# Patient Record
Sex: Female | Born: 1980
Health system: Southern US, Community
[De-identification: ages and names within clinical notes are randomized; demographics above are authoritative.]

## PROBLEM LIST (undated history)

## (undated) DIAGNOSIS — N200 Calculus of kidney: Secondary | ICD-10-CM

## (undated) DIAGNOSIS — E119 Type 2 diabetes mellitus without complications: Secondary | ICD-10-CM

## (undated) HISTORY — DX: Calculus of kidney: N20.0

## (undated) HISTORY — PX: PELVIC LAPAROSCOPY: SHX162

## (undated) HISTORY — DX: Type 2 diabetes mellitus without complications: E11.9

---

## 2000-04-27 ENCOUNTER — Other Ambulatory Visit: Admission: RE | Admit: 2000-04-27 | Discharge: 2000-04-27 | Payer: Self-pay | Admitting: Gynecology

## 2001-05-02 ENCOUNTER — Other Ambulatory Visit: Admission: RE | Admit: 2001-05-02 | Discharge: 2001-05-02 | Payer: Self-pay | Admitting: Gynecology

## 2002-05-15 ENCOUNTER — Other Ambulatory Visit: Admission: RE | Admit: 2002-05-15 | Discharge: 2002-05-15 | Payer: Self-pay | Admitting: Gynecology

## 2002-07-11 ENCOUNTER — Ambulatory Visit (HOSPITAL_COMMUNITY): Admission: RE | Admit: 2002-07-11 | Discharge: 2002-07-11 | Payer: Self-pay | Admitting: Gynecology

## 2002-07-13 ENCOUNTER — Inpatient Hospital Stay (HOSPITAL_COMMUNITY): Admission: AD | Admit: 2002-07-13 | Discharge: 2002-07-13 | Payer: Self-pay | Admitting: Gynecology

## 2002-07-16 ENCOUNTER — Inpatient Hospital Stay (HOSPITAL_COMMUNITY): Admission: AD | Admit: 2002-07-16 | Discharge: 2002-07-16 | Payer: Self-pay | Admitting: Gynecology

## 2002-07-18 ENCOUNTER — Inpatient Hospital Stay: Admission: AD | Admit: 2002-07-18 | Discharge: 2002-07-18 | Payer: Self-pay | Admitting: Gynecology

## 2003-09-06 ENCOUNTER — Encounter (INDEPENDENT_AMBULATORY_CARE_PROVIDER_SITE_OTHER): Payer: Self-pay

## 2003-09-06 ENCOUNTER — Ambulatory Visit (HOSPITAL_BASED_OUTPATIENT_CLINIC_OR_DEPARTMENT_OTHER): Admission: RE | Admit: 2003-09-06 | Discharge: 2003-09-06 | Payer: Self-pay | Admitting: Obstetrics and Gynecology

## 2003-09-08 ENCOUNTER — Ambulatory Visit (HOSPITAL_COMMUNITY): Admission: RE | Admit: 2003-09-08 | Discharge: 2003-09-08 | Payer: Self-pay | Admitting: Obstetrics and Gynecology

## 2003-10-06 HISTORY — PX: DILATION AND CURETTAGE OF UTERUS: SHX78

## 2003-10-29 ENCOUNTER — Encounter (INDEPENDENT_AMBULATORY_CARE_PROVIDER_SITE_OTHER): Payer: Self-pay | Admitting: Specialist

## 2003-10-29 ENCOUNTER — Ambulatory Visit: Admission: AD | Admit: 2003-10-29 | Discharge: 2003-10-29 | Payer: Self-pay | Admitting: Obstetrics and Gynecology

## 2003-11-06 ENCOUNTER — Other Ambulatory Visit: Admission: RE | Admit: 2003-11-06 | Discharge: 2003-11-06 | Payer: Self-pay | Admitting: Obstetrics and Gynecology

## 2004-02-27 ENCOUNTER — Emergency Department (HOSPITAL_COMMUNITY): Admission: EM | Admit: 2004-02-27 | Discharge: 2004-02-27 | Payer: Self-pay | Admitting: Emergency Medicine

## 2004-06-06 IMAGING — CT CT ABDOMEN W/O CM
1 series · 16 of 32 positions shown, 20 images · non-contrast
Comparison: none

CLINICAL DATA: Abdominal pain, back pain and nausea.  Flank pain is on the right side.  
 CT OF THE ABDOMEN WITHOUT CONTRAST, 02/27/04
 No prior studies.
TECHNIQUE: Contiguous axial CT images were obtained from the adrenal glands through the iliac crests.  No contrast was administered.
TECHNIQUE: Contiguous axial CT images were obtained from the iliac crests through the proximal femurs.

[Series 3: — · axial · 0.61mm/px · z∈[-482,-146]mm · 16 of 92 slices shown, 20 images]
[im 6/92  soft-tissue]
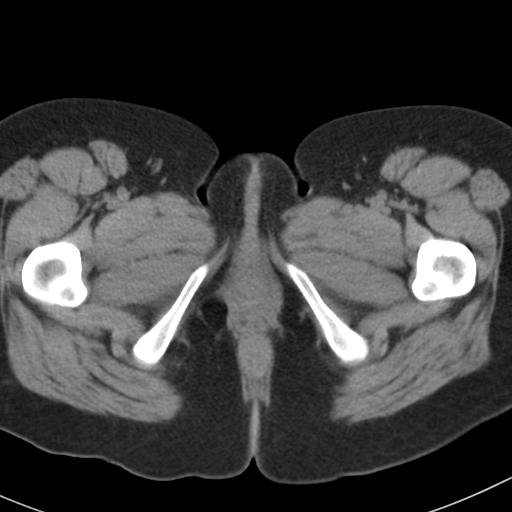
[im 6/92  bone]
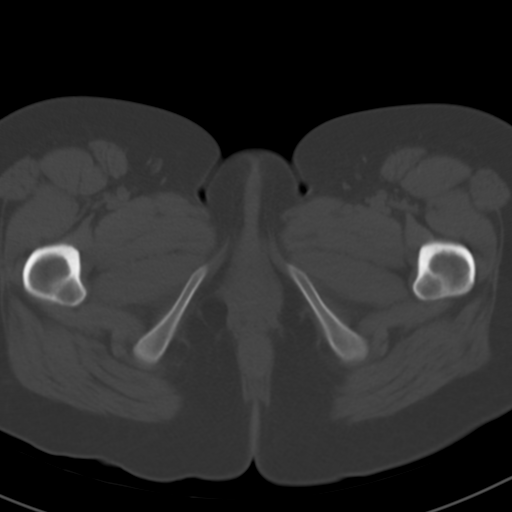
[im 12/92  soft-tissue]
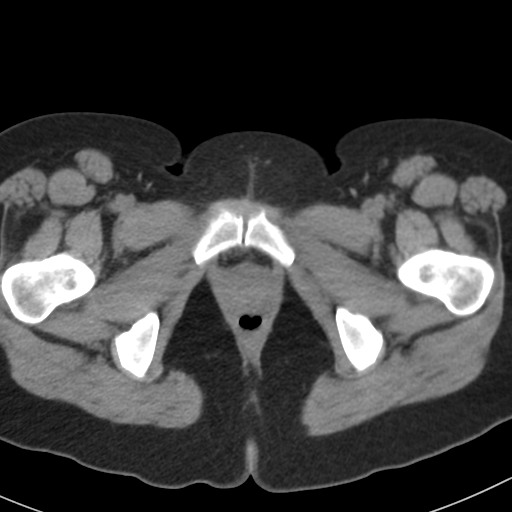
[im 18/92  soft-tissue]
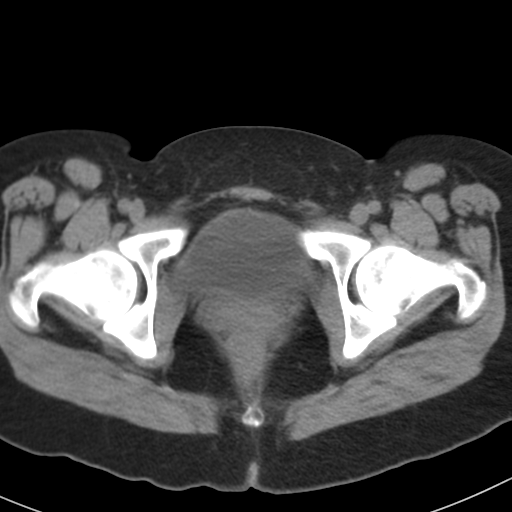
[im 24/92  soft-tissue]
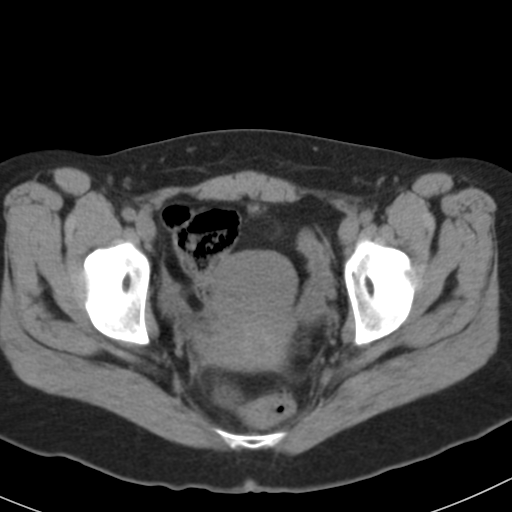
[im 30/92  soft-tissue]
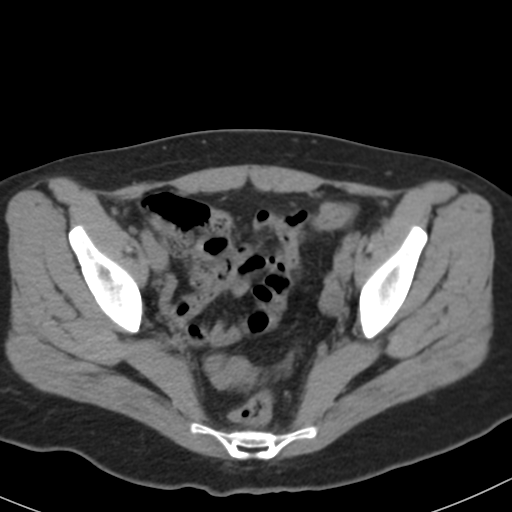
[im 36/92  soft-tissue]
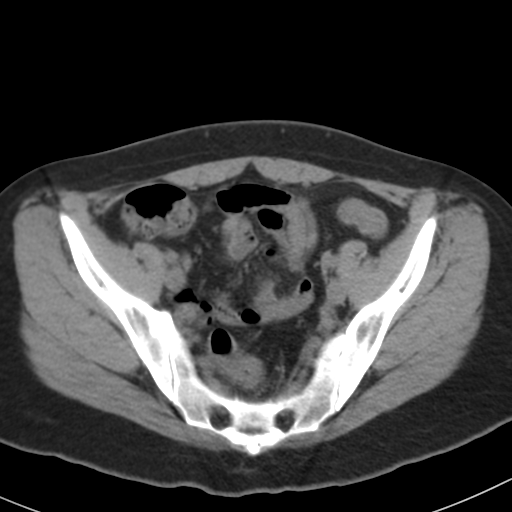
[im 42/92  soft-tissue]
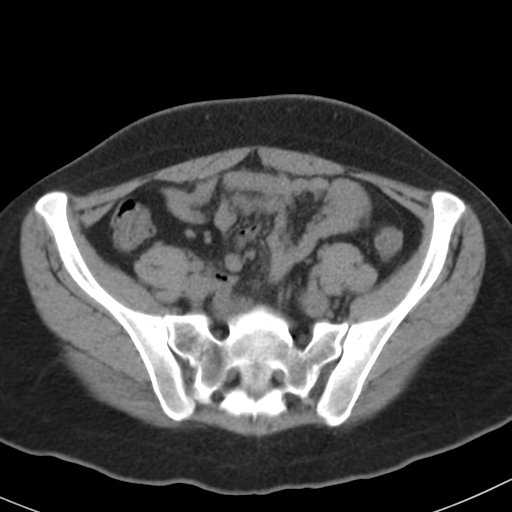
[im 50/92  soft-tissue]
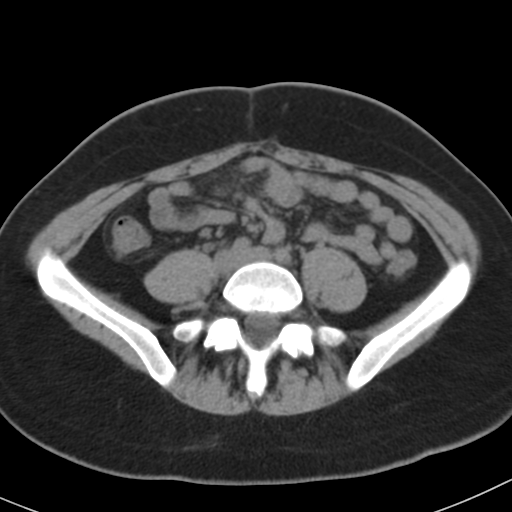
[im 56/92  soft-tissue]
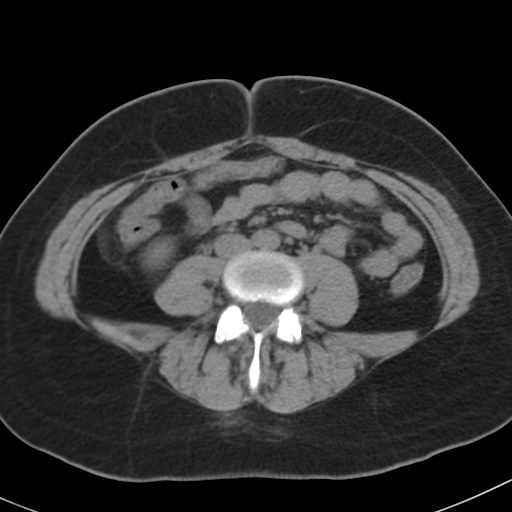
[im 56/92  bone]
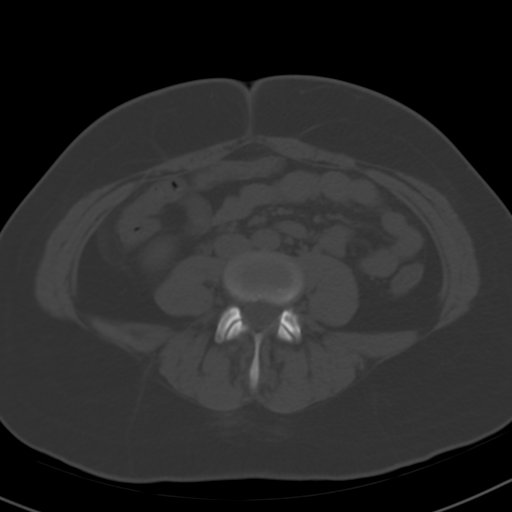
[im 62/92  soft-tissue]
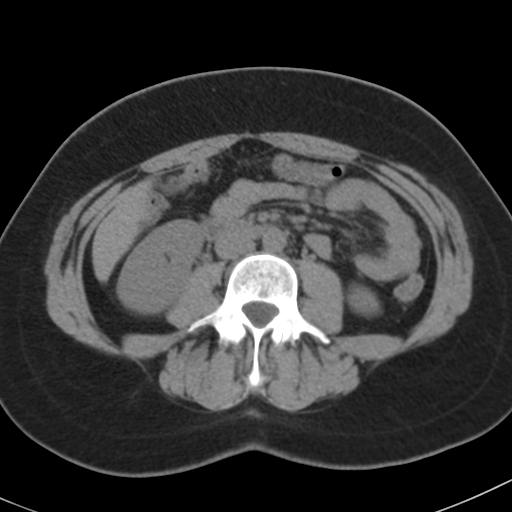
[im 68/92  soft-tissue]
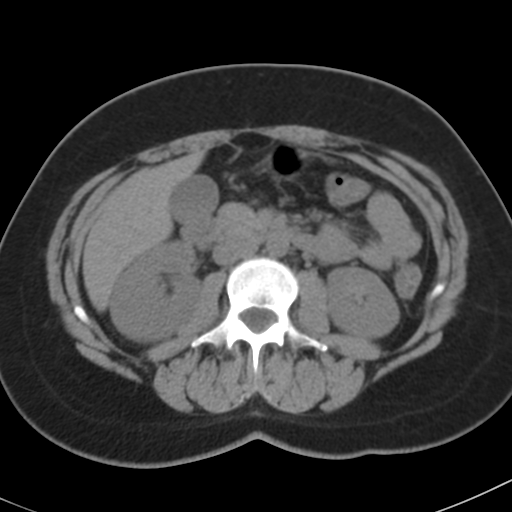
[im 74/92  soft-tissue]
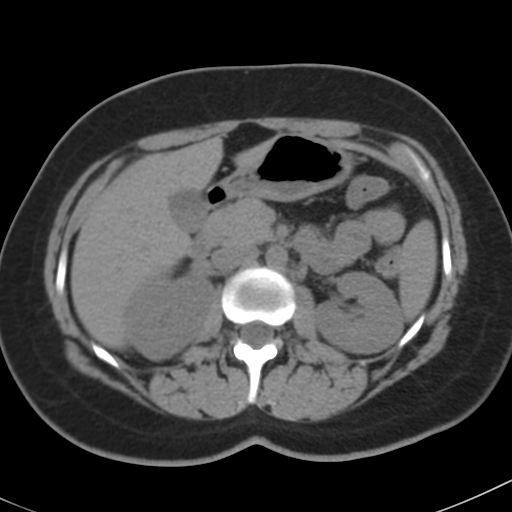
[im 80/92  soft-tissue]
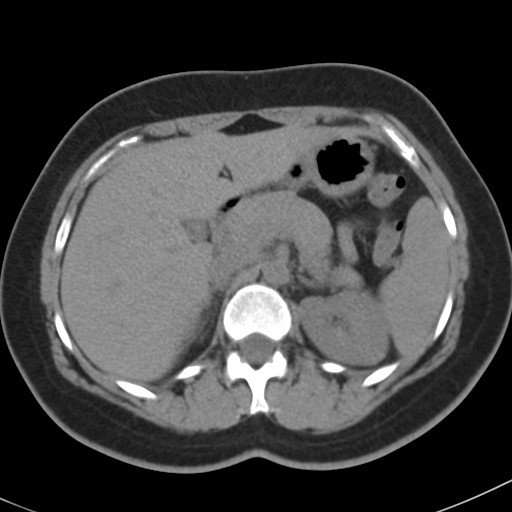
[im 80/92  lung]
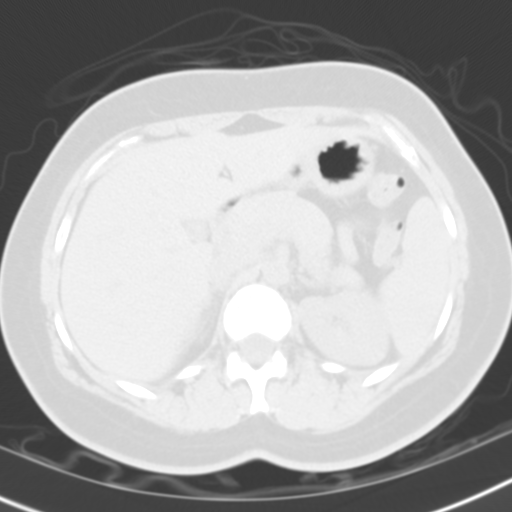
[im 83/92  lung]
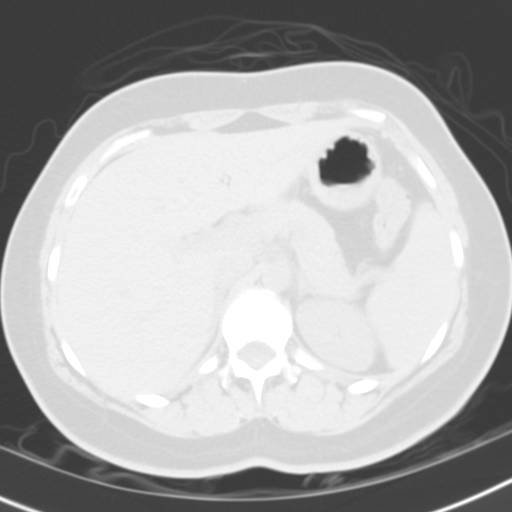
[im 86/92  soft-tissue]
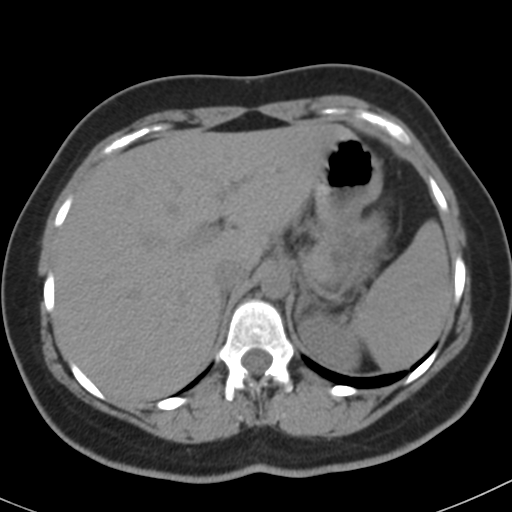
[im 86/92  lung]
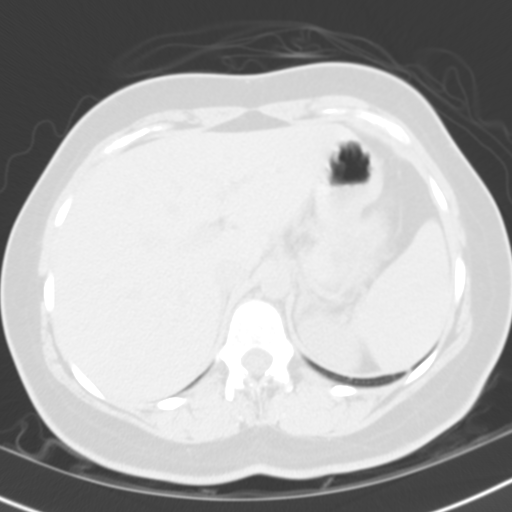
[im 89/92  lung]
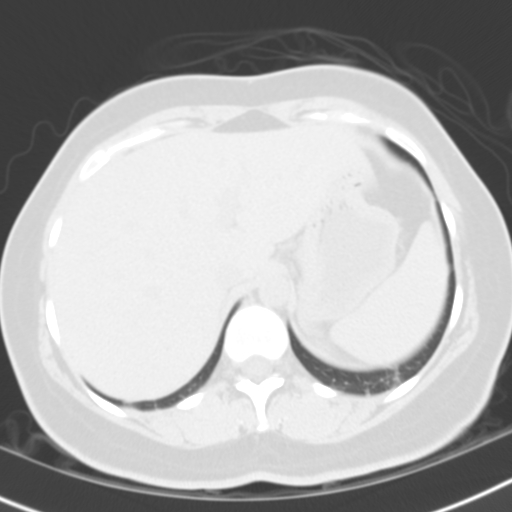

[16 of 32 positions shown; findings below may reference images not displayed]

FINDINGS: There is some subsegmental atelectasis in the left lung base.  There is right perirenal stranding and mild right hydronephrosis in addition to right hydroureter extending down to the level of a 2 mm left ureterovesical junction calculus.  
 IMPRESSION
 Obstructive 2 mm left ureterovesical junction calculus. 
 CT OF THE PELVIS WITHOUT CONTRAST
FINDINGS: The appendix is well visualized and appears unremarkable.  There is mild right hydroureter extending down to the level of a 2 mm mildly obstructive right UVJ calculus.  The visualized bowel appears unremarkable.  
 IMPRESSION
 2 mm mildly obstructive right UVJ calculus.

## 2004-12-17 ENCOUNTER — Other Ambulatory Visit: Admission: RE | Admit: 2004-12-17 | Discharge: 2004-12-17 | Payer: Self-pay | Admitting: Addiction Medicine

## 2005-04-29 ENCOUNTER — Other Ambulatory Visit: Admission: RE | Admit: 2005-04-29 | Discharge: 2005-04-29 | Payer: Self-pay | Admitting: Gynecology

## 2005-08-24 ENCOUNTER — Encounter: Admission: RE | Admit: 2005-08-24 | Discharge: 2005-08-24 | Payer: Self-pay | Admitting: Gynecology

## 2005-10-22 ENCOUNTER — Inpatient Hospital Stay (HOSPITAL_COMMUNITY): Admission: AD | Admit: 2005-10-22 | Discharge: 2005-10-26 | Payer: Self-pay | Admitting: Gynecology

## 2005-10-23 ENCOUNTER — Encounter (INDEPENDENT_AMBULATORY_CARE_PROVIDER_SITE_OTHER): Payer: Self-pay | Admitting: Specialist

## 2005-12-04 ENCOUNTER — Other Ambulatory Visit: Admission: RE | Admit: 2005-12-04 | Discharge: 2005-12-04 | Payer: Self-pay | Admitting: Gynecology

## 2007-04-19 ENCOUNTER — Other Ambulatory Visit: Admission: RE | Admit: 2007-04-19 | Discharge: 2007-04-19 | Payer: Self-pay | Admitting: Obstetrics and Gynecology

## 2008-04-19 ENCOUNTER — Other Ambulatory Visit: Admission: RE | Admit: 2008-04-19 | Discharge: 2008-04-19 | Payer: Self-pay | Admitting: Obstetrics and Gynecology

## 2008-06-14 ENCOUNTER — Ambulatory Visit: Payer: Self-pay | Admitting: Obstetrics and Gynecology

## 2008-06-18 ENCOUNTER — Ambulatory Visit: Payer: Self-pay | Admitting: Obstetrics and Gynecology

## 2008-07-04 ENCOUNTER — Ambulatory Visit: Payer: Self-pay | Admitting: Obstetrics and Gynecology

## 2008-11-26 ENCOUNTER — Ambulatory Visit (HOSPITAL_COMMUNITY): Admission: RE | Admit: 2008-11-26 | Discharge: 2008-11-26 | Payer: Self-pay | Admitting: Obstetrics and Gynecology

## 2008-12-24 ENCOUNTER — Inpatient Hospital Stay (HOSPITAL_COMMUNITY): Admission: AD | Admit: 2008-12-24 | Discharge: 2008-12-30 | Payer: Self-pay | Admitting: Obstetrics and Gynecology

## 2008-12-24 ENCOUNTER — Encounter: Payer: Self-pay | Admitting: Obstetrics and Gynecology

## 2008-12-25 ENCOUNTER — Encounter (INDEPENDENT_AMBULATORY_CARE_PROVIDER_SITE_OTHER): Payer: Self-pay | Admitting: Obstetrics and Gynecology

## 2009-04-03 IMAGING — US US OB LIMITED
1 series · 14 of 23 positions shown · non-contrast
Comparison: none

OBSTETRICAL ULTRASOUND:
 This ultrasound exam was performed in the [HOSPITAL] Ultrasound Department.  The OB US report was generated in the AS system, and faxed to the ordering physician.  This report is also available in [REDACTED] PACS.

[Series 1: us ob limited · 14 of 23 slices shown]
[im 1/23]
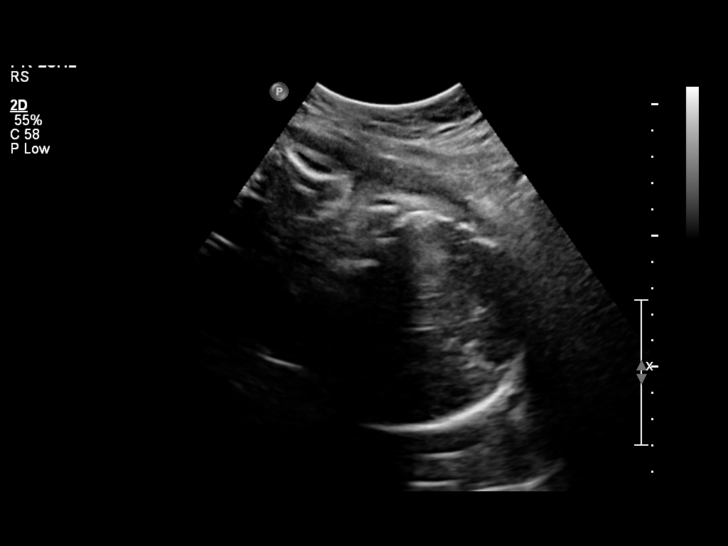
[im 3/23]
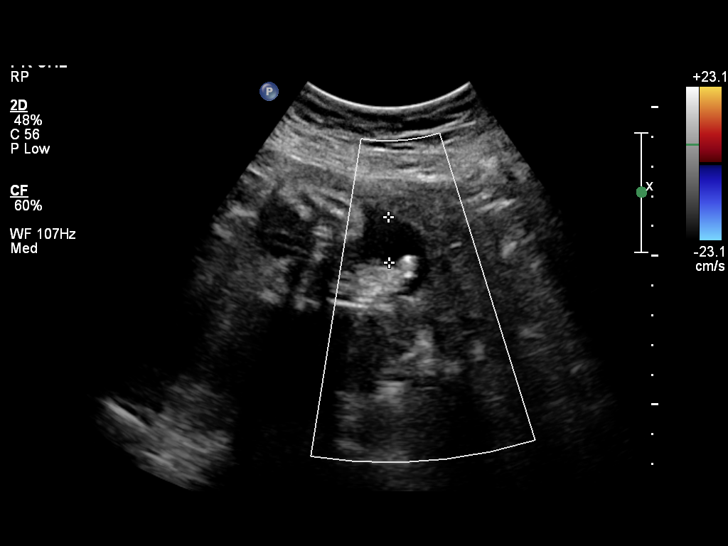
[im 5/23]
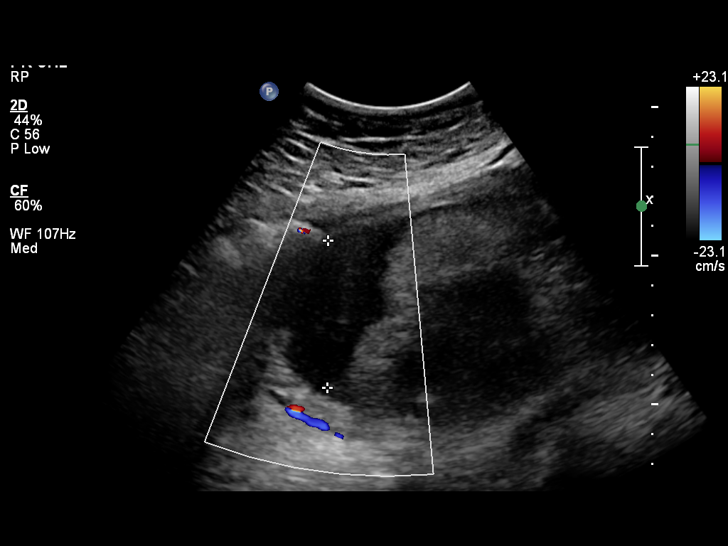
[im 6/23]
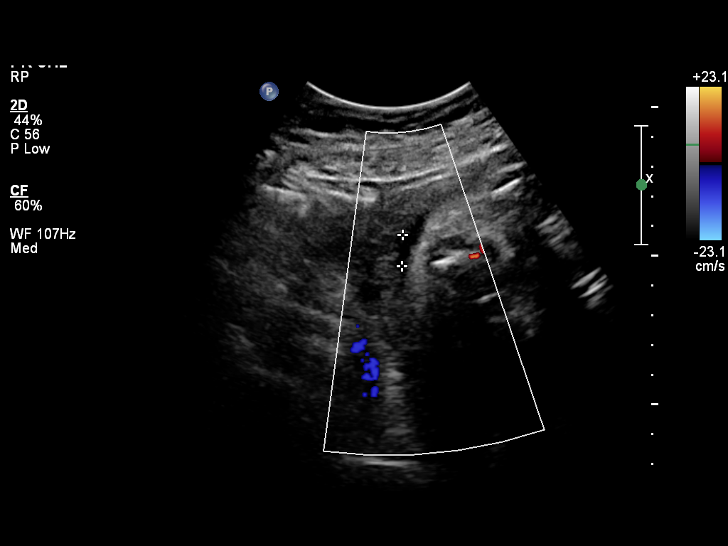
[im 8/23]
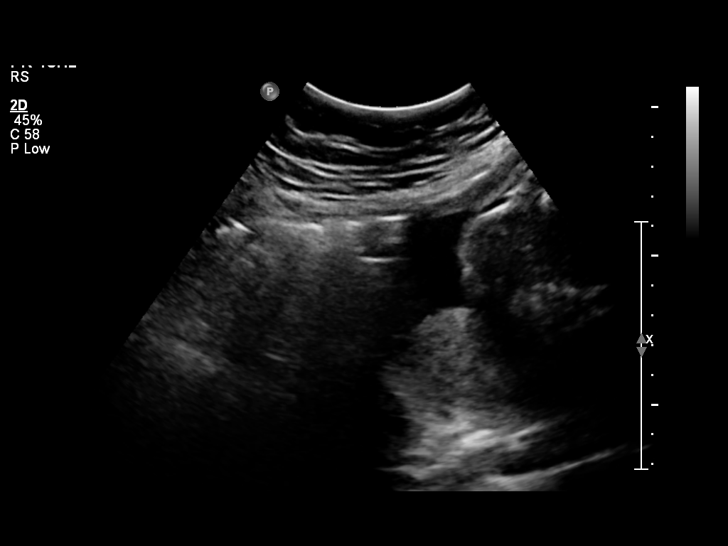
[im 10/23]
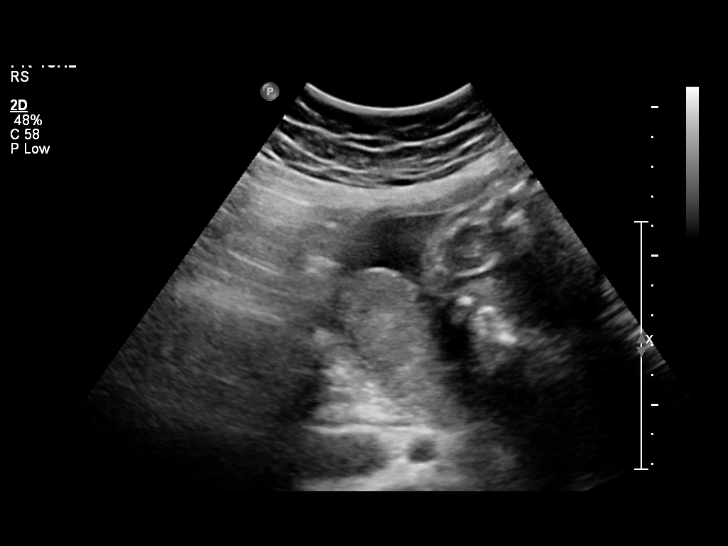
[im 11/23]
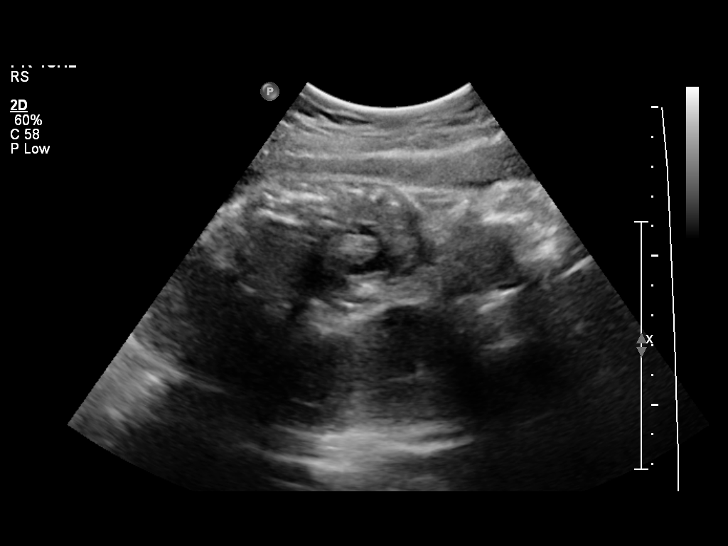
[im 13/23]
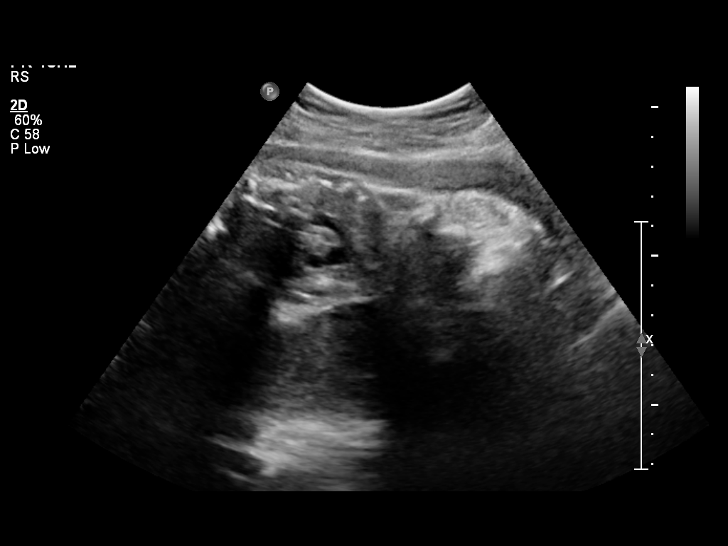
[im 14/23]
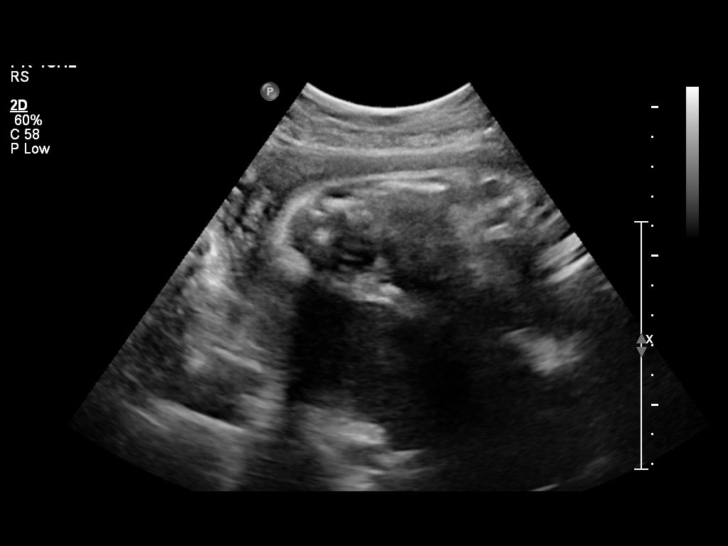
[im 16/23]
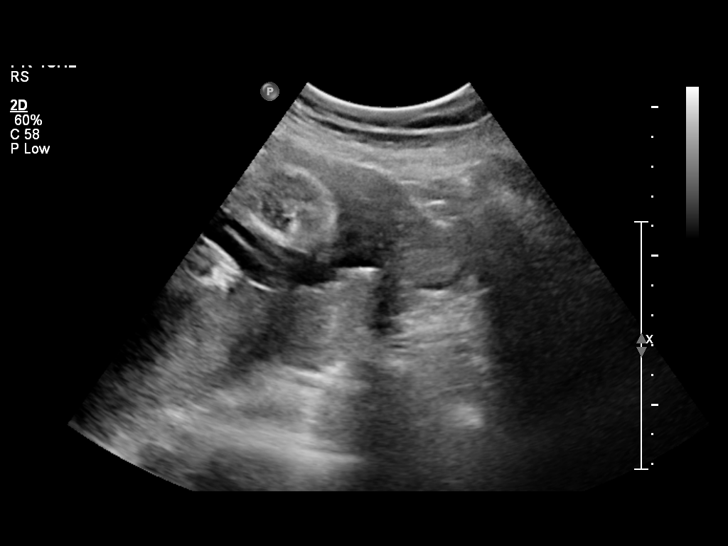
[im 18/23]
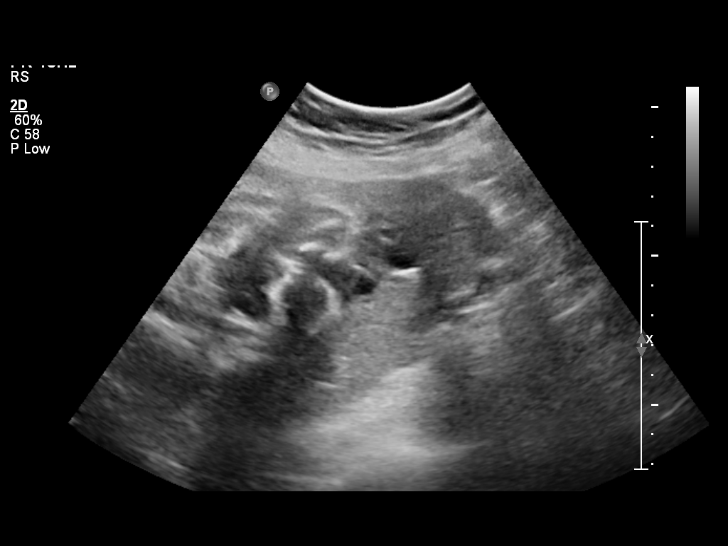
[im 19/23]
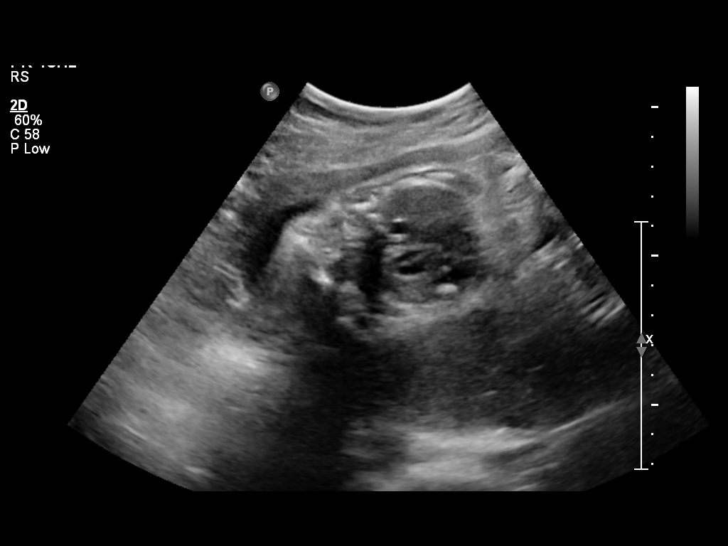
[im 21/23]
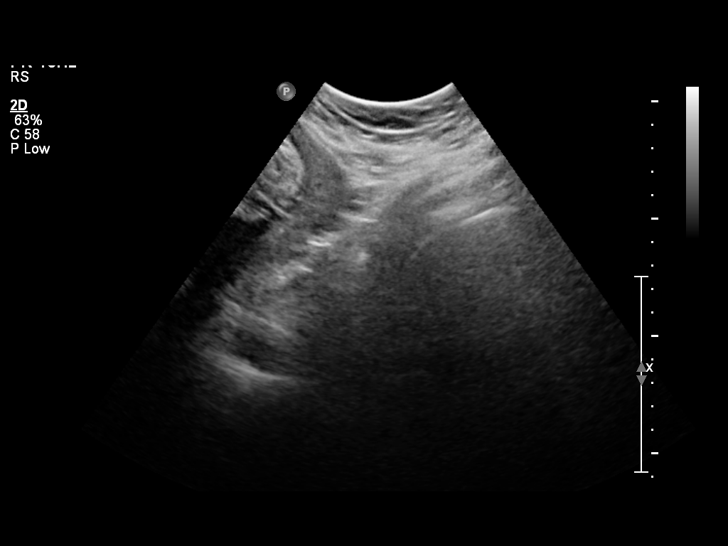
[im 23/23]
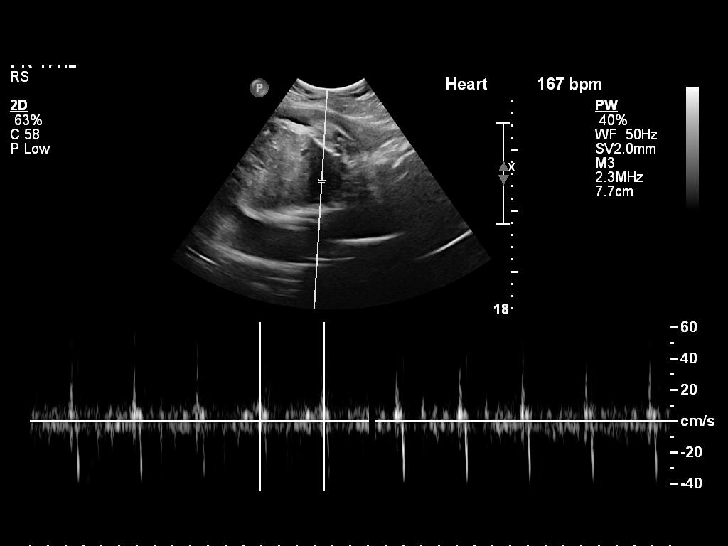

[14 of 23 positions shown; findings below may reference images not displayed]

IMPRESSION: See [facility] Obstetric US report.

## 2009-04-23 ENCOUNTER — Other Ambulatory Visit: Admission: RE | Admit: 2009-04-23 | Discharge: 2009-04-23 | Payer: Self-pay | Admitting: Addiction Medicine

## 2009-04-23 ENCOUNTER — Encounter: Payer: Self-pay | Admitting: Obstetrics and Gynecology

## 2009-04-23 ENCOUNTER — Ambulatory Visit: Payer: Self-pay | Admitting: Obstetrics and Gynecology

## 2009-04-30 ENCOUNTER — Ambulatory Visit: Payer: Self-pay | Admitting: Obstetrics and Gynecology

## 2009-05-08 ENCOUNTER — Ambulatory Visit: Payer: Self-pay | Admitting: Obstetrics and Gynecology

## 2009-05-10 ENCOUNTER — Encounter: Payer: Self-pay | Admitting: Obstetrics and Gynecology

## 2009-05-10 ENCOUNTER — Ambulatory Visit (HOSPITAL_BASED_OUTPATIENT_CLINIC_OR_DEPARTMENT_OTHER): Admission: RE | Admit: 2009-05-10 | Discharge: 2009-05-10 | Payer: Self-pay | Admitting: Obstetrics and Gynecology

## 2009-05-10 HISTORY — PX: VAGINAL HYSTERECTOMY: SUR661

## 2009-06-06 ENCOUNTER — Ambulatory Visit: Payer: Self-pay | Admitting: Obstetrics and Gynecology

## 2009-07-12 ENCOUNTER — Ambulatory Visit: Payer: Self-pay | Admitting: Obstetrics and Gynecology

## 2010-10-26 ENCOUNTER — Encounter: Payer: Self-pay | Admitting: Obstetrics and Gynecology

## 2011-01-15 LAB — COMPREHENSIVE METABOLIC PANEL
ALT: 55 U/L — ABNORMAL HIGH (ref 0–35)
ALT: 62 U/L — ABNORMAL HIGH (ref 0–35)
ALT: 68 U/L — ABNORMAL HIGH (ref 0–35)
AST: 31 U/L (ref 0–37)
AST: 48 U/L — ABNORMAL HIGH (ref 0–37)
AST: 55 U/L — ABNORMAL HIGH (ref 0–37)
Albumin: 2.6 g/dL — ABNORMAL LOW (ref 3.5–5.2)
Albumin: 2.8 g/dL — ABNORMAL LOW (ref 3.5–5.2)
Albumin: 2.9 g/dL — ABNORMAL LOW (ref 3.5–5.2)
Alkaline Phosphatase: 52 U/L (ref 39–117)
Alkaline Phosphatase: 65 U/L (ref 39–117)
Alkaline Phosphatase: 68 U/L (ref 39–117)
Alkaline Phosphatase: 81 U/L (ref 39–117)
BUN: 6 mg/dL (ref 6–23)
BUN: 9 mg/dL (ref 6–23)
CO2: 24 mEq/L (ref 19–32)
CO2: 27 mEq/L (ref 19–32)
Calcium: 8.6 mg/dL (ref 8.4–10.5)
Calcium: 8.8 mg/dL (ref 8.4–10.5)
Calcium: 9 mg/dL (ref 8.4–10.5)
Calcium: 9 mg/dL (ref 8.4–10.5)
Chloride: 104 mEq/L (ref 96–112)
Chloride: 104 mEq/L (ref 96–112)
Chloride: 106 mEq/L (ref 96–112)
Creatinine, Ser: 0.47 mg/dL (ref 0.4–1.2)
Creatinine, Ser: 0.59 mg/dL (ref 0.4–1.2)
Creatinine, Ser: 0.6 mg/dL (ref 0.4–1.2)
Creatinine, Ser: 0.62 mg/dL (ref 0.4–1.2)
GFR calc Af Amer: >60 mL/min (ref >60)
GFR calc Af Amer: >60 mL/min (ref >60)
GFR calc Af Amer: >60 mL/min (ref >60)
GFR calc Af Amer: >60 mL/min (ref >60)
GFR calc non Af Amer: >60 mL/min (ref >60)
GFR calc non Af Amer: >60 mL/min (ref >60)
Glucose, Bld: 112 mg/dL — ABNORMAL HIGH (ref 70–99)
Glucose, Bld: 169 mg/dL — ABNORMAL HIGH (ref 70–99)
Glucose, Bld: 80 mg/dL (ref 70–99)
Glucose, Bld: 99 mg/dL (ref 70–99)
Potassium: 3.7 mEq/L (ref 3.5–5.1)
Potassium: 4.6 mEq/L (ref 3.5–5.1)
Potassium: 4.7 mEq/L (ref 3.5–5.1)
Potassium: 5.1 mEq/L (ref 3.5–5.1)
Sodium: 139 mEq/L (ref 135–145)
Sodium: 139 mEq/L (ref 135–145)
Sodium: 139 mEq/L (ref 135–145)
Sodium: 140 mEq/L (ref 135–145)
Total Bilirubin: 0.2 mg/dL — ABNORMAL LOW (ref 0.3–1.2)
Total Bilirubin: 0.4 mg/dL (ref 0.3–1.2)
Total Protein: 5.5 g/dL — ABNORMAL LOW (ref 6.0–8.3)
Total Protein: 5.5 g/dL — ABNORMAL LOW (ref 6.0–8.3)
Total Protein: 5.9 g/dL — ABNORMAL LOW (ref 6.0–8.3)

## 2011-01-15 LAB — CBC
HCT: 31.5 % — ABNORMAL LOW (ref 36.0–46.0)
HCT: 31.5 % — ABNORMAL LOW (ref 36.0–46.0)
HCT: 32.4 % — ABNORMAL LOW (ref 36.0–46.0)
Hemoglobin: 10 g/dL — ABNORMAL LOW (ref 12.0–15.0)
Hemoglobin: 10.7 g/dL — ABNORMAL LOW (ref 12.0–15.0)
Hemoglobin: 10.9 g/dL — ABNORMAL LOW (ref 12.0–15.0)
MCHC: 33.8 g/dL (ref 30.0–36.0)
MCHC: 34 g/dL (ref 30.0–36.0)
MCHC: 34 g/dL (ref 30.0–36.0)
MCHC: 34.1 g/dL (ref 30.0–36.0)
MCV: 90.1 fL (ref 78.0–100.0)
MCV: 90.9 fL (ref 78.0–100.0)
MCV: 91.8 fL (ref 78.0–100.0)
Platelets: 218 10*3/uL (ref 150–400)
Platelets: 244 10*3/uL (ref 150–400)
Platelets: 252 10*3/uL (ref 150–400)
Platelets: 265 10*3/uL (ref 150–400)
RBC: 3.24 MIL/uL — ABNORMAL LOW (ref 3.87–5.11)
RBC: 3.44 MIL/uL — ABNORMAL LOW (ref 3.87–5.11)
RBC: 3.46 MIL/uL — ABNORMAL LOW (ref 3.87–5.11)
RBC: 3.58 MIL/uL — ABNORMAL LOW (ref 3.87–5.11)
RBC: 3.92 MIL/uL (ref 3.87–5.11)
RDW: 13.6 % (ref 11.5–15.5)
RDW: 13.9 % (ref 11.5–15.5)
RDW: 14 % (ref 11.5–15.5)
WBC: 10 10*3/uL (ref 4.0–10.5)
WBC: 10 10*3/uL (ref 4.0–10.5)
WBC: 10.7 10*3/uL — ABNORMAL HIGH (ref 4.0–10.5)
WBC: 11.1 10*3/uL — ABNORMAL HIGH (ref 4.0–10.5)

## 2011-01-15 LAB — URINALYSIS, ROUTINE W REFLEX MICROSCOPIC
Bilirubin Urine: NEGATIVE
Glucose, UA: NEGATIVE mg/dL
Hgb urine dipstick: NEGATIVE
Ketones, ur: 15 mg/dL — AB
Protein, ur: 100 mg/dL — AB
Protein, ur: NEGATIVE mg/dL
Specific Gravity, Urine: 1.01 (ref 1.005–1.030)
Urobilinogen, UA: 0.2 mg/dL (ref 0.0–1.0)
Urobilinogen, UA: 0.2 mg/dL (ref 0.0–1.0)
pH: 6 (ref 5.0–8.0)
pH: 6 (ref 5.0–8.0)

## 2011-01-15 LAB — URIC ACID
Uric Acid, Serum: 5.7 mg/dL (ref 2.4–7.0)
Uric Acid, Serum: 6.3 mg/dL (ref 2.4–7.0)
Uric Acid, Serum: 6.5 mg/dL (ref 2.4–7.0)
Uric Acid, Serum: 6.5 mg/dL (ref 2.4–7.0)

## 2011-01-15 LAB — URINE CULTURE: Special Requests: NEGATIVE

## 2011-01-15 LAB — LACTATE DEHYDROGENASE
LDH: 124 U/L (ref 94–250)
LDH: 164 U/L (ref 94–250)
LDH: 168 U/L (ref 94–250)
LDH: 184 U/L (ref 94–250)
LDH: 201 U/L (ref 94–250)

## 2011-01-15 LAB — ABO/RH: ABO/RH(D): O POS

## 2011-01-15 LAB — URINE MICROSCOPIC-ADD ON

## 2011-02-17 NOTE — Discharge Summary (Signed)
NAMEBRAYLI, [name]                 ACCOUNT NO.:  [id number]   MEDICAL RECORD NO.:  [id number]          PATIENT TYPE:  INP   LOCATION:  9309                          FACILITY:  [facility]   PHYSICIAN:  Zenaida Niece, M.D.DATE OF BIRTH:  June 13, 1981   DATE OF ADMISSION:  12/24/2008  DATE OF DISCHARGE:  12/30/2008                               DISCHARGE SUMMARY   ADMISSION DIAGNOSES:  Intrauterine pregnancy at 31 plus weeks, elevated  blood pressures and abnormal fetal umbilical artery Dopplers,  gestational diabetes and previous cesarean section.   DISCHARGE DIAGNOSES:  Intrauterine pregnancy at 31 plus weeks, elevated  blood pressures and abnormal fetal umbilical artery Dopplers,  gestational diabetes and previous cesarean section, and preterm labor as  well as possible abruption.   PROCEDURE:  On December 25, 2008, Dr. Senaida Ores performed repeat low  transverse cesarean section.   HISTORY AND PHYSICAL:  This is a 30 year old gravida 7, para 0-1-5-1  with an EGA of 31 plus weeks, who was admitted for observation due to  elevated umbilical artery Dopplers and elevated blood pressure noted  with a visit with Maternal Fetal Medicine on the day of admission.  An  ultrasound also showed an estimated fetal weight at 20th percentile and  a borderline decreased amniotic fluid index of 8.9.  Prenatal care has  been complicated by previous history of preeclampsia with delivery of 36  weeks by cesarean section.  She was cleared for a trial labor and was  considering VBAC.  She also has gestational diabetes which has been well  controlled with diet.   PRENATAL LABS:  Blood type is O positive with negative antibody screen,  RPR nonreactive, rubella immune, hepatitis B surface antigen negative,  HIV negative, gonorrhea and chlamydia negative.   PAST OB HISTORY:  Five spontaneous abortions and then one cesarean  section at 36 weeks.  Baby weighed 4 pounds 6 ounces, complicated by  preeclampsia.   PAST SURGICAL HISTORY:  Laparoscopy.   PHYSICAL EXAMINATION:  VITAL SIGNS:  She is afebrile with stable vital  signs.  Admission blood pressure is 136/81.  Fetal heart tracing is  reassuring with mild variable decelerations.  ABDOMEN:  Gravid and nontender.  EXTREMITIES:  2+ edema.  DTRs were normal.   ADMISSION LABS:  No proteinuria, many rbc's, 3-6 white blood cells,  platelet count 238,000.  Normal liver enzymes, creatinine 0.47, uric  acid slightly elevated to 5.7.   HOSPITAL COURSE:  The patient was admitted and on admission had a  variable decelerations down to 80-90 range for 3-4 minutes with good  recovery.  She got betamethasone to help with pulmonary maturation and  she was started on a 24-hour urine collection.  Fetal heart tracing  continued to show fairly frequent mild variable decelerations.  Dr.  Senaida Ores was called on the evening of December 24, 2008, due to  increasing contractions and vaginal bleeding.  Fetal heart tracing was  reassuring and bedside ultrasound did not reveal a significant clot or  placental abnormality.  Cervix at that time was 1-2, 75% effaced, and  posterior with dark red  blood.  She was started on magnesium sulfate to  try and quiesce her contractions as well as possibly give some  protection against cerebral palsy.  Contractions persisted and vaginal  bleeding persisted.  Thus, early on the morning of December 25, 2008, Dr.  Senaida Ores took her to the operating room for repeat low transverse  cesarean section.  This was done with a right extension due to a narrow  lower uterine segment.  There were noted to be blood clots in the  uterine cavity.  This was done under spinal anesthesia.  She delivered a  viable female with Apgars of 7 and 7 that weighed 2 pounds 11 ounces.  Arterial cord pH was 7.11. Placenta was sent for pathology and estimated  blood loss was 500 mL.  Postpartum, initially the patient did very well.  Blood pressures were stable and  labs remained stable.  Initial  hemoglobin after delivery was down to 10.  Baby was in the NICU,  initially fairly stable.  On the evening of postoperative #2, blood  pressures were elevated and she had a headache.  Dr. Ellyn Hack started her  on p.o. labetalol to help with her blood pressures.  PIH labs were drawn  on the morning of postoperative #3 which was December 28, 2008.  These  revealed slightly elevated liver enzymes.  These were repeated  throughout the day and did continue to slightly increased to 50-60  range.  Blood pressures throughout the day remained stable on her  labetalol.  Blood pressures then did gradually increased to 150-170/80-  90 range and her labetalol was increased to 200 mg b.i.d.  She had no  other significant symptoms.  Labs on December 29, 2008, remained stable.  Blood pressure was continued to be monitored.  On the morning of December 30, 2008 which was postoperative #5, again she was feeling fine without  problems.  Blood pressures were stable at 150- 170/80-100 on labetalol  200 mg b.i.d.  Labs that morning revealed decreased liver enzymes,  although uric acid was slightly elevated.  At that time, she was felt to  be stable enough to be discharged home.   DISCHARGE INSTRUCTIONS:  Regular diet, pelvic rest, no strenuous  activity.  Follow up is in 1 week for blood pressure check.   MEDICATIONS:  1. Percocet p.r.n. pain.  2. Over-the-counter ibuprofen p.r.n. pain.  3. Labetalol 200 mg p.o. b.i.d.   She was given our discharge pamphlet.       Zenaida Niece, M.D.  Electronically Signed     TDM/MEDQ  D:  12/30/2008  T:  12/30/2008  Job:  [id number]

## 2011-02-17 NOTE — Op Note (Signed)
[name], [name]                 ACCOUNT NO.:  [id number]   MEDICAL RECORD NO.:  [id number]          PATIENT TYPE:  AMB   LOCATION:  [facility]                         FACILITY:  [facility]   PHYSICIAN:  Daniel L. Gottsegen, M.D.DATE OF BIRTH:  20-Aug-1981   DATE OF PROCEDURE:  05/10/2009  DATE OF DISCHARGE:                               OPERATIVE REPORT   PREOPERATIVE DIAGNOSIS:  Symptomatic endometriosis.   POSTOPERATIVE DIAGNOSIS:  Symptomatic endometriosis.   NAME OF OPERATION:  Laparoscopic-assisted vaginal hysterectomy, lysis of  pelvic adhesions, right salpingectomy.   SURGEON:  Daniel L. Eda Paschal, MD.   FIRST ASSISTANT:  Juan H. Lily Peer, MD.   ANESTHESIA:  General endotracheal.   INDICATIONS:  Patient is a 30 year old gravida 3, para 2, AB 1 who has a  long history of severe dysmenorrhea, menorrhagia and a previous  diagnosis of endometriosis made at the time of laparoscopy.  The patient  is now finished having her family.  The dysmenorrhea, menorrhagia and  dyspareunia are getting worse and she would like to proceed with  definitive surgery.  She has not responded to any medical conservative  therapy.   FINDINGS:  At the time of surgery the patient did have some omental  adhesions at the site of her previous Pfannenstiel incision from the  cesarean section.  There was white endometriosis on the serosa of the  uterus plus the uterus looked boggy, consistent with adenomyosis.  Left  ovary and fallopian tube were entirely normal without any evidence of  endometriosis.  Right ovary had several endometrial implants on it.  In  addition, the right fallopian tube was distorted and twisted, probably  from her previous surgery.  The rest of the exploration of the abdomen  and pelvis was unremarkable.   PROCEDURE:  After adequate general endotracheal anesthesia the patient  was placed in the dorsal lithotomy supine position, prepped and draped  in a sterile manner.  A Hulka  catheter was inserted into the uterus, a  Foley catheter was inserted into the bladder.  Using the Optiview,  through a subumbilical incision the laparoscope was inserted under  direct vision without any trauma, a pneumoperitoneum was created.  Two 5-  mm ports were placed in the right and left lower quadrant.  The pelvis  was visualized and was as noted above.  Initially, dissection was done  not removing the fallopian tube.  Both round ligaments, utero-ovarian  ligaments and fallopian tubes were coagulated and cut using the  ligature.  The vesicouterine fold of peritoneum was sharply dissected  free.  This was extremely difficult because of her two previous  cesareans, but it was done atraumatically without entering the bladder.  At this point, it was noted that the right fallopian tube was severely  twisted, it was adherent to the utero-ovarian ligament, and therefore it  was elected to do a salpingectomy.  Again using a ligature, the  fallopian tube was completely dissected free and removed through the  scope and sent to pathology for tissue diagnosis.  It should be noted  that prior to any dissection on the uterus  or the fallopian tubes the  omental adhesions were taken down with the ligature without any  difficulty, there was no bowel attached and there was no bleeding during  this part of the procedure.  The bladder flap continued to be advanced  after the tube had been removed.  The uterine arteries bilaterally were  cauterized and cut with the ligature and then the surgeons went below.  A 1:200,000 solution of epinephrine was injected around the cervix.  A  360 degree incision was made around the cervix.  The bladder was  mobilized superiorly as was the posterior peritoneum.  The posterior  peritoneum and vesicouterine fold of peritoneum were entered by sharp  dissection.  The uterosacral, cardinal ligaments and the balance of the  broad ligament was then clamped, cut and suture  ligated with #1 chromic  catgut.  The uterus was also sent to pathology along with the right  fallopian tube.  The posterior peritoneum was sutured to the cuff with a  running locking 0 Vicryl.  The uterosacral ligaments, when they had been  separated from the uterus, were shortened and sutured to the vault  laterally for good vault support.  A modified McCall's enterocele suture  was also placed with 3-0 Vicryl to eliminate any risk of enterocele in  the future.  The cuff was then closed with figure-of-eights and #1  chromic catgut.  Copious irrigation was done with sterile saline and the  surgeons regloved and went above.  There was no bleeding noted so the  procedure was terminated.  All cul-de-sac fluid was removed, the trocars  were removed.  The subumbilical fascial incision was closed with 0  Vicryl and the skin incisions were closed with 3-0 Monocryl.  The  patient tolerated the procedure well and left the operating room in  satisfactory condition draining clear urine from her foley catheter.      Daniel L. Eda Paschal, M.D.  Electronically Signed     DLG/MEDQ  D:  05/10/2009  T:  05/10/2009  Job:  [id number]

## 2011-02-17 NOTE — Op Note (Signed)
NAMESHEMIA, [name]                 ACCOUNT NO.:  [id number]   MEDICAL RECORD NO.:  [id number]          PATIENT TYPE:  INP   LOCATION:  NA                            FACILITY:  [facility]   PHYSICIAN:  Huel Cote, M.D. DATE OF BIRTH:  15-Apr-1981   DATE OF PROCEDURE:  12/25/2008  DATE OF DISCHARGE:                               OPERATIVE REPORT   PREOPERATIVE DIAGNOSES:  1. A 31 plus weeks gestation.  2. Suspected abruption.  3. Previous cesarean section.  4. Fetal heart rate decelerations.   POSTOPERATIVE DIAGNOSES:  1. A 31 plus weeks gestation.  2. Suspected abruption.  3. Previous cesarean section.  4. Fetal heart rate decelerations with abruption confirmed.   PROCEDURE:  Repeat low transverse cesarean section with a left extension  secondary to a very narrow lower uterine segment.   SURGEON:  Dr. Huel Cote, MD   ANESTHESIA:  Spinal.   FINDINGS:  There is a viable female infant with the vertex deep in the  pelvis.  Apgars were 7/7, weight was 1220 g.  Cord pH was 7.11.  Upon  entering the uterine cavity, there were free blood and clots noted in  the fluid consistent with abruption.  The placenta as well was delivered  with adherent clot noted and sent to pathology.   ESTIMATED BLOOD LOSS:  500 mL.   URINE OUTPUT:  200 mL clear urine.   IV FLUIDS:  2000 mL LR.   PROCEDURE:  The patient was taken to the operating room where spinal  anesthesia was obtained without difficulty with fetal heart rate  monitored closely throughout.  She was then prepped and draped in normal  sterile fashion in dorsal and supine position with a leftward tilt.  A  Pfannenstiel skin incision was then made through a preexisting scar and  carried through to the underlying layer of fascia by sharp dissection  and Bovie cautery.  Fascia was then nicked in midline and the incision  was extended laterally with Mayo scissors bilaterally.  The inferior  aspect was grasped with Kocher clamps,  elevated and dissected off the  underlying rectus muscles.  In a similar fashion, the superior aspect  was dissected off the rectus muscles.  The rectus muscles were then  separated in the midline and the peritoneal cavity entered sharply.  This peritoneal incision was then extended both superiorly and  inferiorly with careful attention to avoid both bowel and bladder.  The  uterus was examined and found to have a narrow lower uterine segment.  However was noted that the vertex was engaged in the pelvis rather well  and was it felt given this that the segment was widened up to proceed in  a transverse fashion.  Therefore, a transverse incision was made through  a slightly thickened area of the lower uterine segment and the cavity  itself entered bluntly.  Then the blood clots and bloody fluid were  encountered.  This incision was extended bluntly and was at the level of  the infant's shoulders.  The infant's head was deeply engaged in the  pelvis and  with slight difficulty was elevated to the level of the  incision and delivered atraumatically.  Nose and mouth were bulb  suctioned and the cord clamped and infant handed to the waiting NICU  team with a spontaneous cry.  The cord pH and cord blood were obtained.  The placenta was easily manually delivered and adherent clot noted.  The  uterine fundus was cleared of all clots and debris with moist lap sponge  and was exteriorized for better visualization of the incision itself.  The incision was then closed with 1-0 chromic in a running locked  fashion.  At the left aspect of the incision, there was an extension  which extended out to the broad ligament itself, given the narrow lower  uterine segment, and this was bleeding at the angle and did require  several interrupted sutures both of 3-0 Vicryl and the 1-0 chromic to  bring it under control.  It appeared to be close to the level of the  uterine artery and therefore great care was taken to  establish  hemostasis in this area.  The broad ligament and posterior uterine  fundus were carefully palpated while the sutures were placed and no  areas of concern such as the ureter were felt to be in the close  proximity.  The urine remained clear and it appear that the bladder was  well away from the sutures as well and hemostasis was eventually  obtained.  The abdomen and pelvis were then irrigated with fluid and all  areas carefully examined once more.  The right tube and ovary appeared  normal.  The left tube and ovary did have some adhesions and scarring  along the posterior uterus, however, appeared normal otherwise and this  seemed consistent with the patient's prior history of endometriosis.  The uterus was returned to the abdomen and all areas inspected very  closely with irrigation to ensure no active bleeding had recurred.  All  appeared hemostatic, therefore all instruments and sponges were removed  from the patient's abdomen.  The rectus muscles were reapproximated with  several interrupted sutures of 0 Vicryl.  The fascia was closed with 0  Vicryl in a running fashion and the skin was closed with staples.  Sponge, lap, and needle counts were correct x2 and the patient was taken  to the recovery room in good condition.      Huel Cote, M.D.  Electronically Signed     KR/MEDQ  D:  12/25/2008  T:  12/25/2008  Job:  [id number]

## 2011-02-20 NOTE — Op Note (Signed)
NAME:  [name], HODGENS A                           ACCOUNT NO.:  [id number]   MEDICAL RECORD NO.:  [id number]                   PATIENT TYPE:  AMB   LOCATION:  [facility]                                 FACILITY:  [facility]   PHYSICIAN:  Daniel L. Eda Paschal, M.D.           DATE OF BIRTH:  September 26, 1981   DATE OF PROCEDURE:  10/29/2003  DATE OF DISCHARGE:                                 OPERATIVE REPORT   PREOPERATIVE DIAGNOSIS:  Nonviable pregnancy with severe hemorrhaging.   POSTOPERATIVE DIAGNOSIS:  Nonviable pregnancy with severe hemorrhaging.   NAME OF OPERATION:  Suction curettage.   ANESTHESIA:  MAC plus paracervical block.   SURGEON:  Daniel L. Eda Paschal, M.D.   FINDINGS:  At the time of surgery, the patient was having severe clotting  with many clots coming out of her cervical os.  The os was slightly open.  External and vaginal exam was normal.  Uterus was top-normal size and shape.  Adnexa were palpably normal.   PROCEDURE:  After the patient was taken to the operating room and placed in  the dorsal lithotomy position, she was given MAC by anesthesia and a 20 cc  1% plain paracervical block by Dr. Eda Paschal.  Anesthesia was excellent.  A  single-tooth tenaculum was placed in the anterior lip of the cervix.  The  cervix was dilated to #25 [facility] dilator and then a suction curettage was  done with a #8 vacuum curette with tissue obtained.  At the termination of  the procedure, the bleeding which had been heavy at the beginning had  stopped.  The uterus was well contracted and there was no significant  bleeding.  Tissue was sent to pathology for tissue diagnosis.  The patient  tolerated the procedure well and left the operating room in satisfactory  condition.                                               Daniel L. Eda Paschal, M.D.    Tonette Bihari  D:  10/29/2003  T:  10/30/2003  Job:  [id number]

## 2011-02-20 NOTE — H&P (Signed)
[name], [name]                             ACCOUNT NO.:  [id number]   MEDICAL RECORD NO.:  [id number]                   PATIENT TYPE:   LOCATION:                                       FACILITY:   PHYSICIAN:  Juan H. Lily Peer, M.D.             DATE OF BIRTH:   DATE OF ADMISSION:  07/11/2002  DATE OF DISCHARGE:                                HISTORY & PHYSICAL   CHIEF COMPLAINT:  Missed AB.   HISTORY OF PRESENT ILLNESS:  The patient is a 30 year old, gravida 2, para  0, AB 1 (one elective AB) who was contemplating on getting pregnant if after  a year she had not conceived. She was seen in the office on October 1  complaining of some vaginal bleeding. She had been to the office September  29 for a blood pregnancy test due to the fact that she stated her pregnancy  test at home taken was faintly positive. On September 29, her HCG was  positive and we asked her to get a quantitative which was 222. I got the  results after I evaluated her and got an ultrasound. The pelvic exam  demonstrated brownish blood in the vaginal vault, no active bleeding, the  cervix was closed, the uterus measured 8.1 x 3.1 x 4.6 cm of endometrial  stripe. It was 4.8 mm, no gestational sac was seen, there was a right  ovarian complex cyst measuring 29 x 27 x 33 mm. The patient stated her last  menstrual period had been August 15 but she spotted on the 12th through the  present time and then on and off. There is a question of whether this could  have been an early pregnancy or whether she is having a miscarriage. Her  follow-up quantitative HCG on October 1 was 257 and today still leveled off  at 312. Her blood type was O+. She had some vaginal bleeding on and off. Her  cervical os was closed, there was no active bleeding per say and she is  scheduled to undergo a D&E tomorrow, October 7 at [facility] at 2  p.m. The patient denies any medical allergies.   FAMILY HISTORY:  Grandparents and father  with non-insulin dependent  diabetes.   MEDICATIONS:  She is currently on no other medications except the prenatal  vitamins. She recently had CBC, prolactin, TSH back on August 11 which all  were normal.   PHYSICAL EXAMINATION:  VITAL SIGNS:  The patient weighs 154 pounds, blood  pressure was 122/82, height 5 feet 4 inches tall.  HEENT:  Unremarkable.  NECK:  Supple. Trachea midline. No carotid bruits, no thyromegaly.  LUNGS:  Clear to auscultation without rhonchi or wheezes.  HEART:  Regular rate and rhythm, no murmurs or gallops.  BREASTS:  Not done.  ABDOMEN:  Soft, nontender without rebound or guarding.  PELVIC:  Bartholin's, urethra and Skene's glands within normal  limits.  Vagina with some blood in the vaginal vault. Uterus approximately 4-6 week  size, no palpable adnexal masses.  RECTAL:  Not done.   LABORATORY AND ACCESSORY DATA:  The patient had an ultrasound on October 1  which demonstrated no gestational sac seen and this was entirely possible as  we consider that her pregnancy was very early, but due to the leveling off  of her quantitative beta HCG, it appears she had a missed AB. She had a  right ovarian complex cyst but no adnexal mass per say that would raise the  suspicions for an ectopic pregnancy. She also has a negative history that  would place her at increased risk for it.   ASSESSMENT:  A 30 year old, gravida 2, para 0, AB 1 with evidence of a  missed AB scheduled to undergo a D&E tomorrow, October 7 at [facility]. The risks, benefits, pros and cons of the operation were discussed  to include infection, bleeding, trauma to internal organs. In the event of  any problem controlling bleeding and the patient were to need a blood  transfusion, she is fully aware of the potential risks for blood and blood  products such as anaphylactic reaction, hepatitis and AIDS. In an effort to  prevent infection, she will receive prophylaxis antibiotics and also a  small  risk of a deep venous thrombosis and a pulmonary embolism or perforation of  the uterus requiring open abdominal exploration to correct any  intrauterine/abdominal trauma/pelvic trauma. All these issues were discussed  with the patient. Her blood type is O positive and will follow accordingly.   PLAN:  The patient is scheduled for a D&E at 2 p.m., October 7 at [facility].                                                Basalt H. Lily Peer, M.D.    JHF/MEDQ  D:  07/10/2002  T:  07/10/2002  Job:  [id number]

## 2011-02-20 NOTE — Discharge Summary (Signed)
NAMETERUKO, JOSWICK                 ACCOUNT NO.:  [id number]   MEDICAL RECORD NO.:  [id number]          PATIENT TYPE:  INP   LOCATION:  9302                          [facility]ACILITY:  [facility]   PHYSICIAN:  Juan H. Lily Peer, M.D.DATE OF BIRTH:  Mar 19, 1981   DATE OF ADMISSION:  10/22/2005  DATE OF DISCHARGE:  10/25/2005                                 DISCHARGE SUMMARY   HISTORY:  The patient is a 30 year old gravida 5, para 0, who on October 22, 2005, the patient was taken to the operating room by Dr. Colin Broach  with a diagnosis of pregnancy at 36 weeks, labor, preeclampsia, although  normal PIH labs with nonreassuring fetal heart rate tracings, suspected  abruptio placenta.  The patient delivered a viable female infant, Apgars of 8  and 9 with a weight of 4 pounds 6 ounces.  Arterial cord pH is 7.18.  The  patient had been kept in the AICU with magnesium sulfate or was discontinued  after 24 hours.  She was transferred to the floor.  Her blood pressures had  been running in the 130s and diastolics in the 80s to 90s on the floor.  Her  blood type was O positive, rubella immune.  Her hemoglobin and hematocrit  were 10.5 and 30.5 respectively with a platelet count of 201,000.  The  patient denied any visual disturbances or unusual headaches or right upper  quadrant pain.  Her incision was intact.  Her diet was advanced from a clear  to a regular diet.  She was up and ambulating, passing flatus and was ready  to be discharged home on her third postpartum day.   FINAL DIAGNOSES:  1.  36 weeks estimated gestational age.  2.  Vaginal bleeding, suspected abruptio placenta.  3.  Nonreassuring heart rate tracing.  4.  Preeclampsia.   PROCEDURE PERFORMED:  Emergency primary low uterine segment tranverse  cesarean section.   FINAL DISPOSITION AND FOLLOWUP:  The patient was discharged home after three  days in the hospital.  She was normotensive.  No blurry vision, no right  upper quadrant pain,  no unusual headaches.  DTRs 1+, negative clonus, trace  edema.  She was given instructions to followup in the office in six weeks  for a postpartum visit.  Signs and symptoms of preeclampsia were discussed.  A prescription for Tylox, to take 1 p.o. q.4-6h. p.r.n. was provided, as  well as to continue her iron supplementation for anemia and her prenatal  vitamins.      Juan H. Lily Peer, M.D.  Electronically Signed     JHF/MEDQ  D:  10/25/2005  T:  10/26/2005  Job:  [id number]

## 2011-02-20 NOTE — Op Note (Signed)
NAME:  [name], [name]                           ACCOUNT NO.:  [id number]   MEDICAL RECORD NO.:  [id number]                   PATIENT TYPE:  AMB   LOCATION:  [facility]                                 FACILITY:  [facility]   PHYSICIAN:  Daniel L. Eda Paschal, M.D.           DATE OF BIRTH:  10-Mar-1981   DATE OF PROCEDURE:  09/06/2003  DATE OF DISCHARGE:                                 OPERATIVE REPORT   PREOPERATIVE DIAGNOSIS:  Endometriosis suspected.   POSTOPERATIVE DIAGNOSIS:  Endometriosis.   OPERATION:  Laparoscopy with lysis of pelvic adhesions and laser  vaporization of endometriosis.   SURGEON:  Daniel L. Eda Paschal, M.D.   ANESTHESIA:  General.   FINDINGS:  At the time of surgery the patient had dense pelvic adhesive  disease involving her right adnexa.  The ovary and the tube were completely  enwrapped with adhesions.  They appeared to be endometriotic in nature.  Once they were completely excised, she had [name] healthy-looking ovary without  endometriosis and [name] healthy-looking fallopian tube with normal fimbriate,  and there was now normal mobility.  The adhesions not only were around the  ovary and the tube, but they adhered the adnexa to the broad ligament.  The  uterus as mobile, normal size and shape, without endometriosis.  There were  two implants of endometriosis near the left uterosacral ligament, both of  which were about [name] third of [name] centimeter.  There were two or three implants  of endometriosis on the left ovary without adhesions, both of which were  about 0.5 cm.  The left fallopian tube was normal with luxuriant fimbriae,  and there were no peritubular and parovarian adhesions on the left side.  The vesicouterine fold of peritoneum was free of endometriosis.  The  ileocecal junction was identified and the patient's appendix was normal.  The right upper quadrant was visualized and was likewise normal.   DESCRIPTION OF PROCEDURE:  After adequate general endotracheal  anesthesia,  the patient was placed in the dorsal lithotomy position and prepped and  draped in the usual sterile manner.  [name] pneumoperitoneum was created with [name]  Veress needle introduced subumbilically, and that incision was extended and  the 10 mm trocar was placed subumbilically and through that the operating  laparoscope was placed.  [name] successful pneumoperitoneum had been created  without any trauma.  [name] second 5 mm trocar was placed suprapubically in the  midline.  Through that both an irrigator/aspirator and grasping instruments  were placed.  The pelvis was visualized and was as noted above.  Using an  Nd:YAG laser with [name] frosted GR4 tip over the bare fiber, 12 watts power,  adhesions could be lysed so that the right ovary and tube could be freed up.  This was done slowly and systematically to prevent any trauma to the tube or  the ovary.  Adhesions were sent to confirm the endometriosis.  Finally all  adhesions could be removed both from the ovary and tube and the broad  ligament where they were adherent to, so that the patient had [name] completely  free, mobile pelvis.  There were several obvious implants there that were  also laser-vaporized.  Attention was then turned to the left side and the  area on the broad ligament and on the ovary described above were also laser  vaporized until they were completely gone.  Copious irrigation was then done  with Ringer's lactate and then all cul-de-sac fluid was removed.  An attempt  was made to see if the tubes were patent, but we could not get dye to come  out of the tubes.  The patient was in the luteal phase and, in addition, the  seal appeared not to be good with the Jarcho cannula.  Tubal patency had  previously been demonstrated bilaterally.  At this point the procedure was  terminated, the trocars were  removed, the pneumoperitoneum was evacuated, the subumbilical fascial  incision was closed with 0 Vicryl, and the skin incision was closed  with 4-0  Monocryl.  Estimated blood loss for the entire procedure was less than 50 mL  with none replaced.  The patient tolerated the procedure well and left the  operating room in satisfactory condition.                                               Daniel L. Eda Paschal, M.D.    Tonette Bihari  D:  09/06/2003  T:  09/06/2003  Job:  [id number]

## 2011-02-20 NOTE — H&P (Signed)
[name], Kathleen                 ACCOUNT NO.:  [id number]   MEDICAL RECORD NO.:  [id number]          PATIENT TYPE:  MAT   LOCATION:  MATC                          [facility]ACILITY:  [facility]   PHYSICIAN:  Timothy P. Fontaine, M.D.DATE OF BIRTH:  1981-03-18   DATE OF ADMISSION:  10/22/2005  DATE OF DISCHARGE:                                HISTORY & PHYSICAL   CHIEF COMPLAINT:  Labor.   HISTORY OF PRESENT ILLNESS:  A 30 year old G5, P0 female at [redacted] weeks  gestation who enters complaining of bloody show with painful uterine  contractions. The patient notes she was doing well until later in the  afternoon noticed some bloody discharge, started to come to the hospital and  in the interim started to have regular painful contractions. She has no  gross rupture of membranes. Prenatal course shows some history of glucose  intolerance but otherwise was unremarkable. For remainder of her history see  her [place].   PHYSICAL EXAMINATION:  VITAL SIGNS:  Blood pressure 169/104, 166/97, 160/95.  Vital signs are stable.  HEENT:  Normal.  LUNGS:  Clear.  CARDIAC:  Regular rate without murmurs, rubs, or gallops.  ABDOMEN:  Gravid, vertex fetus, consistent with 36 weeks. External monitor  with contractions every 2-3 minutes. Reactive fetal tracing.  PELVIC:  Two to three cm, 80% effaced, 0 station, vertex presentation with  bloody show.  EXTREMITIES:  Deep tendon reflexes 1 to 2+, no clonus, no pitting peripheral  edema.   ASSESSMENT AND PLAN:  30 year old G5, P0 at 36 weeks in labor with elevated  blood pressures without symptoms of headache, blurred vision, epigastric  pain, nausea,vomiting or other preeclamptic symptoms. Will check PIH labs.  Consider magnesium sulfate after admission and serial blood pressure  measurements when laboratory values return. Situation was discussed with  patient and her husband who understand and agree. The patient does have a  history of being told she had a urinary  tract infection and was to pick up  antibiotics this evening. Will also check a culture with her urinalysis and  consider antibiotic treatment.      Timothy P. Fontaine, M.D.  Electronically Signed     TPF/MEDQ  D:  10/22/2005  T:  10/22/2005  Job:  [id number]

## 2011-02-20 NOTE — Op Note (Signed)
[name], [name]                 ACCOUNT NO.:  [id number]   MEDICAL RECORD NO.:  [id number]          PATIENT TYPE:  INP   LOCATION:  9373                          [facility]ACILITY:  [facility]   PHYSICIAN:  Timothy P. Fontaine, M.D.DATE OF BIRTH:  1980-12-22   DATE OF PROCEDURE:  10/23/2005  DATE OF DISCHARGE:                                 OPERATIVE REPORT   PREOPERATIVE DIAGNOSES:  1.  Pregnancy at 36 weeks.  2.  Labor.  3.  Preeclampsia.  4.  Nonreassuring fetal heart rate tracing.  5.  Abruptio placenta suspect.   POSTOPERATIVE DIAGNOSES:  1.  Pregnancy at 36 weeks.  2.  Labor.  3.  Preeclampsia.  4.  Nonreassuring fetal heart rate tracing.  5.  Abruptio placenta suspect.   PROCEDURE:  Primary low transverse cervical cesarean section.   SURGEON:  Timothy P. Fontaine, M.D.   ASSISTANT:  Scrub technician.   ANESTHETIC:  Epidural.   ESTIMATED BLOOD LOSS:  Less than 500 cc.   COMPLICATIONS:  None.   SPECIMEN:  1.  Cord blood  2.  Arterial cord pH.  3.  Cord blood banking.  4.  Placenta and umbilical cord.   FINDINGS:  At 87 normal female, Apgars8 and 9, weight 4 pounds 6 ounces.  Arterial cord pH 7.18.  Placenta with darkened area at the edge,  questionable area of abruptio.  Pelvic anatomy noted to be normal.   DESCRIPTION OF PROCEDURE:  The patient was taken to the operating room,  underwent dosing of her epidural catheter, received an abdominal preparation  with Betadine solution and was draped in the usual fashion.  A Foley  catheter was previously placed on labor and delivery.  After assuring  adequate anesthesia, the abdomen was sharply entered through a Pfannenstiel  incision  achieving adequate hemostasis at all levels.  The patient was  having significant discomfort at peritoneal stimulation and per anesthesia  recommendations, Nesacaine was within the lower uterine segment of the  peritoneal cavity for complete anesthesia.  The bladder flap was sharply and  bluntly developed without difficulty, and the uterus was sharply entered and  the lower uterine segment bluntly extended laterally.  A blood clot was  encountered.  Extra membrane consistent with a diagnosis of abruptio.  Membranes were ruptured.  The fluid was noted to be clear.  The infant's  head was delivered through the incision.  The nares and mouth suctioned.  The rest of the infant was delivered.  The cord was doubly clamped and cut  and the infant handed to pediatrics in attendance.  Arterial cord pH was  obtained.  Samples of cord blood obtained.  The placenta was extracted and  sent for cord blood banking retrieval ultimately for pathology.  Uterus was  then exteriorized.  The endometrial cavity was explored with a sponge to  remove all placental and membrane fragments.  The uterine incision was then  closed in two layers using 0 Vicryl suture, first in a running interlocking  stitch followed by an imbricating stitch.  The patient received 1 gram Ancef  antibiotic prophylaxis at  this time.  The uterus was then returned to the  abdomen which was copiously irrigated.  Adequate hemostasis was visualized.  The anterior fascia was reapproximated using 0 Vicryl suture in a running  stitch. Subcutaneous tissues were irrigated.  Adequate hemostasis achieved  with electrocautery.  The skin was reapproximated using 4-0 Vicryl in a  running subcuticular stitch and Dermabond skin adhesive.  The patient was  taken to recovery room in good condition having tolerated the procedure  well.      Timothy P. Fontaine, M.D.  Electronically Signed     TPF/MEDQ  D:  10/23/2005  T:  10/23/2005  Job:  [id number]

## 2011-02-20 NOTE — Op Note (Signed)
[name], SERENA                             ACCOUNT NO.:  [id number]   MEDICAL RECORD NO.:  [id number]                   PATIENT TYPE:  AMB   LOCATION:  DAY                                  [facility]ACILITY[facility]   PHYSICIAN:  Juan H. Lily Peer, M.D.             DATE OF BIRTH:  05/01/1981   DATE OF PROCEDURE:  07/11/2002  DATE OF DISCHARGE:                                 OPERATIVE REPORT   SURGEON:  Juan H. Lily Peer, M.D.   INDICATIONS FOR OPERATION:  A 5 year old gravida 3, para 0, AB1 with  leveling off of quantitative beta hCG, vaginal bleeding suspicious of having  missed AB.   PREOPERATIVE DIAGNOSES:  Missed abortion.   POSTOPERATIVE DIAGNOSES:  Missed abortion?   ANESTHESIA:  MAC and paracervical block with 1% Xylocaine.   PROCEDURE PERFORMED:  Dilatation and evacuation.   FINDINGS:  No products of conception at time of D&C.  Upper limits of normal  sized uterus with no palpable adnexal masses.   DESCRIPTION OF OPERATION:  After the patient was adequately counseled she  was taken to the operating room where she underwent successful MAC  anesthesia.  She was placed in the lower lithotomy position.  Vagina and  perineum were prepped and draped in the usual sterile fashion.  Foley  catheter is inserted in an effort to monitor urinary out.  __________  red  rubber Robinson inserted into the bladder to evacuate bladder contents for  75 cc.  She did receive 1 g Cefotan prophylactically.  A Graves' speculum  was inserted into the vaginal vault.  The cervical stroma was infiltrated  with 1% Xylocaine at the 2, 4, 8, and 10 o'clock position.  The cervix was  dilated to an 8 mm size Pratt dilator followed by insertion of a 7 mm  suction curette.  After several attempts as well as with a Hunter curette,  no tissue was obtained raising the question whether in the past two days  since the patient was last seen in the office whether she had a complete AB  or whether she may have an  underlying small ectopic pregnancy detected on  ultrasound for which we will follow quantitative beta hCGs postoperatively.  The patient was transferred to recovery room with stable vital signs.  She  will receive Toradol 30 mg IV and her blood type is O+.  Blood loss was  minimal.  Fluid resuscitation consisted of 800 cc of lactated Ringer's.  She  did receive 1 g Cefotan prophylactically.                                               Juan H. Lily Peer, M.D.    JHF/MEDQ  D:  07/11/2002  T:  07/11/2002  Job:  [id number]

## 2011-07-15 ENCOUNTER — Ambulatory Visit (INDEPENDENT_AMBULATORY_CARE_PROVIDER_SITE_OTHER): Payer: BC Managed Care – PPO | Admitting: Obstetrics and Gynecology

## 2011-07-15 ENCOUNTER — Telehealth: Payer: Self-pay | Admitting: *Deleted

## 2011-07-15 ENCOUNTER — Encounter: Payer: Self-pay | Admitting: Obstetrics and Gynecology

## 2011-07-15 VITALS — Temp 98.9°F

## 2011-07-15 DIAGNOSIS — R102 Pelvic and perineal pain: Secondary | ICD-10-CM

## 2011-07-15 DIAGNOSIS — N949 Unspecified condition associated with female genital organs and menstrual cycle: Secondary | ICD-10-CM

## 2011-07-15 NOTE — Progress Notes (Addendum)
Patient woke up today with suprapubic pain. It has gotten somewhat better as the day went on. She had chills this morning but no fever. She has no fever now either. She has had a ruptured ovarian cyst before and this seems similar.  Abdomen is soft without guarding rebound or masses. External: Within normal limits. Vaginal exam: Within normal limits. Cervix and uterus surgically absent. Adnexa: Fails to reveal masses and nontender. Urinalysis negative. CBC drawn and normal.  Assessment: Pelvic pain. Possible ruptured ovarian cyst  Plan: Return in a.m. For ultrasound. Take ibuprofen. Prescription for Vicodin 5 mg # 10 written.

## 2011-07-15 NOTE — Telephone Encounter (Signed)
Pt states c/o intense low ab pain this am with chills, the intensity has no gone away but still feels heaviness and fullness in low ab. Had hyst 2 years ago and wants to be checked.appt today made. Kw

## 2011-07-16 ENCOUNTER — Ambulatory Visit (INDEPENDENT_AMBULATORY_CARE_PROVIDER_SITE_OTHER): Payer: BC Managed Care – PPO | Admitting: Obstetrics and Gynecology

## 2011-07-16 ENCOUNTER — Other Ambulatory Visit: Payer: Self-pay | Admitting: Obstetrics and Gynecology

## 2011-07-16 ENCOUNTER — Ambulatory Visit (INDEPENDENT_AMBULATORY_CARE_PROVIDER_SITE_OTHER): Payer: BC Managed Care – PPO

## 2011-07-16 DIAGNOSIS — N83209 Unspecified ovarian cyst, unspecified side: Secondary | ICD-10-CM

## 2011-07-16 DIAGNOSIS — D3910 Neoplasm of uncertain behavior of unspecified ovary: Secondary | ICD-10-CM

## 2011-07-16 DIAGNOSIS — N809 Endometriosis, unspecified: Secondary | ICD-10-CM

## 2011-07-16 DIAGNOSIS — R1903 Right lower quadrant abdominal swelling, mass and lump: Secondary | ICD-10-CM

## 2011-07-16 DIAGNOSIS — R102 Pelvic and perineal pain: Secondary | ICD-10-CM

## 2011-07-16 NOTE — Progress Notes (Signed)
The patient came back to see me today. She still having pain but she thinks it's tolerable. Her CBC and urinalysis were normal. We went ahead and did an ultrasound today. Her uterus is surgically absent. On her right ovary is a thin walled cystic mass with a focal reticular internal echo pattern. The mass is about 4 cm. It does not have blood flow. It is difficult to visualize the left ovary because of this cyst but it appears to be normal. She does have fluid in her cul-de-sac consistent with a ruptured cyst.  Assessment: Ovarian cyst with possible rupture.  Plan: For the moment we will do expectant treatment. She will keep me up-to-date on symptoms. If doing well we will do another ultrasound in 8 weeks.

## 2012-05-03 ENCOUNTER — Ambulatory Visit (INDEPENDENT_AMBULATORY_CARE_PROVIDER_SITE_OTHER): Payer: BC Managed Care – PPO | Admitting: Obstetrics and Gynecology

## 2012-05-03 ENCOUNTER — Encounter: Payer: Self-pay | Admitting: Obstetrics and Gynecology

## 2012-05-03 VITALS — BP 120/70 | Ht 64.5 in | Wt 180.0 lb

## 2012-05-03 DIAGNOSIS — N83209 Unspecified ovarian cyst, unspecified side: Secondary | ICD-10-CM | POA: Insufficient documentation

## 2012-05-03 DIAGNOSIS — Z01419 Encounter for gynecological examination (general) (routine) without abnormal findings: Secondary | ICD-10-CM

## 2012-05-03 DIAGNOSIS — R102 Pelvic and perineal pain: Secondary | ICD-10-CM

## 2012-05-03 DIAGNOSIS — N949 Unspecified condition associated with female genital organs and menstrual cycle: Secondary | ICD-10-CM

## 2012-05-03 NOTE — Progress Notes (Signed)
Patient came to see me today for her annual GYN exam. We have seen her in October for right lower quadrant pain and she had an ovarian cyst. It looked like it it ruptured but was still present. She was supposed to return for followup of her cyst but her pain went away and she canceled the ultrasound followup. The cyst was 4 cm. She is now having right lower quadrant pain again. She is also having dyspareunia again. She's never had abnormal Pap smears. She had a LAVH for pelvic pain and endometriosis. She had a complete physical with lab work at her PCP this month.  HEENT: Within normal limits. Kennon Portela present. Neck: No masses. Supraclavicular lymph nodes: Not enlarged. Breasts: Examined in both sitting and lying position. Symmetrical without skin changes or masses. Abdomen: Soft no masses guarding or rebound. No hernias. Pelvic: External within normal limits. BUS within normal limits. Vaginal examination shows good estrogen effect, no cystocele enterocele or rectocele. Cervix and uterus absent. Adnexa within normal limits. Rectovaginal confirmatory. Extremities within normal limits.  Assessment: #1. Right lower quadrant pain #2. Possible persistent right ovarian cyst #3. Endometriosis  Plan: Pelvic ultrasound scheduled.The new Pap smear guidelines were discussed with the patient. No pap done.

## 2012-05-03 NOTE — Patient Instructions (Addendum)
Schedule pelvic ultrasound

## 2012-05-04 LAB — URINALYSIS W MICROSCOPIC + REFLEX CULTURE
Bacteria, UA: NONE SEEN
Glucose, UA: NEGATIVE mg/dL
Hgb urine dipstick: NEGATIVE
Leukocytes, UA: NEGATIVE
Specific Gravity, Urine: 1.02 (ref 1.005–1.030)
pH: 5.5 (ref 5.0–8.0)

## 2012-05-05 ENCOUNTER — Ambulatory Visit (INDEPENDENT_AMBULATORY_CARE_PROVIDER_SITE_OTHER): Payer: BC Managed Care – PPO | Admitting: Obstetrics and Gynecology

## 2012-05-05 ENCOUNTER — Ambulatory Visit (INDEPENDENT_AMBULATORY_CARE_PROVIDER_SITE_OTHER): Payer: BC Managed Care – PPO

## 2012-05-05 DIAGNOSIS — N83209 Unspecified ovarian cyst, unspecified side: Secondary | ICD-10-CM

## 2012-05-05 DIAGNOSIS — R102 Pelvic and perineal pain: Secondary | ICD-10-CM

## 2012-05-05 DIAGNOSIS — N949 Unspecified condition associated with female genital organs and menstrual cycle: Secondary | ICD-10-CM

## 2012-05-05 NOTE — Progress Notes (Signed)
Patient came back today for ultrasound. Her vaginal cuff appears normal. On her right ovary there is an echo-free thin-walled cyst of 3.6 centimeters. This is smaller than the cyst seen in October of 2012 and also is no longer complex making me think this is a different cyst. The cyst is also avascular. Her left ovary is normal. Her cul-de-sac is free of fluid.  Assessment: Right ovarian cyst with right lower quadrant pain.  Plan: I think the cyst will probably resolve. I also think her pain will resolve. Unlike what we saw last year I do not believe this is endometriosis. We will therefore defer surgery and we ultrasound her in 8 weeks.

## 2012-06-30 ENCOUNTER — Other Ambulatory Visit: Payer: BC Managed Care – PPO

## 2012-06-30 ENCOUNTER — Ambulatory Visit (INDEPENDENT_AMBULATORY_CARE_PROVIDER_SITE_OTHER): Payer: BC Managed Care – PPO | Admitting: Obstetrics and Gynecology

## 2012-06-30 ENCOUNTER — Ambulatory Visit (INDEPENDENT_AMBULATORY_CARE_PROVIDER_SITE_OTHER): Payer: BC Managed Care – PPO

## 2012-06-30 ENCOUNTER — Ambulatory Visit: Payer: BC Managed Care – PPO | Admitting: Obstetrics and Gynecology

## 2012-06-30 DIAGNOSIS — N83209 Unspecified ovarian cyst, unspecified side: Secondary | ICD-10-CM

## 2012-06-30 DIAGNOSIS — N949 Unspecified condition associated with female genital organs and menstrual cycle: Secondary | ICD-10-CM

## 2012-06-30 DIAGNOSIS — R102 Pelvic and perineal pain: Secondary | ICD-10-CM

## 2012-06-30 NOTE — Progress Notes (Signed)
Patient came back today for followup ultrasound. She presented last year with pelvic pain and a large ovarian cyst. When we did her followup ultrasound the cyst was smaller and less complex. The patient's pain disappeared completely although she is now having a little bit discomfort on the left side. On ultrasound today her uterus is surgically absent. She is status post LAVH, lysis of adhesions, right salpingectomy for her endometriosis. Her right ovary today is completely normal with complete resolution of cyst. Her left ovary does show an echo-free thin-walled avascular cyst of 1.8 cm consistent with a follicle. Her cul-de-sac is free of fluid.  Assessment: Resolution of right ovarian cyst  Plan: Return in August for annual exam. Return sooner if pain increases.

## 2012-06-30 NOTE — Patient Instructions (Signed)
Return if pain increases.

## 2012-07-21 ENCOUNTER — Encounter: Payer: Self-pay | Admitting: Women's Health

## 2012-07-21 ENCOUNTER — Ambulatory Visit (INDEPENDENT_AMBULATORY_CARE_PROVIDER_SITE_OTHER): Payer: BC Managed Care – PPO | Admitting: Women's Health

## 2012-07-21 DIAGNOSIS — R350 Frequency of micturition: Secondary | ICD-10-CM

## 2012-07-21 DIAGNOSIS — IMO0001 Reserved for inherently not codable concepts without codable children: Secondary | ICD-10-CM

## 2012-07-21 LAB — URINALYSIS W MICROSCOPIC + REFLEX CULTURE
Crystals: NONE SEEN
Leukocytes, UA: NEGATIVE
Nitrite: NEGATIVE
Specific Gravity, Urine: 1.02 (ref 1.005–1.030)
Urobilinogen, UA: 0.2 mg/dL (ref 0.0–1.0)

## 2012-07-21 MED ORDER — SULFAMETHOXAZOLE-TRIMETHOPRIM 800-160 MG PO TABS: 1.0000 | ORAL_TABLET | Freq: Two times a day (BID) | ORAL | Status: DC

## 2012-07-21 NOTE — Progress Notes (Signed)
Patient ID: [name], female   DOB: [birth year], 31 y.o.   MRN: [id number] Presents with complaint of increased urinary frequency and urgency with slight burning. States symptoms are intermittent for the past week. Denies any vaginal discharge, irritation or dyspareunia. Denies a fever or back pain. History of a hysterectomy for endometriosis. States has about one UTI per year with similar symptoms.  Exam: No CVAT. Appears well. UA: 0- 2 WBCs, few bacteria. Declined vaginal exam.  Urinary frequency  Plan: Urine culture pending. Prescription for Septra DS one by mouth twice a day for 3 days if symptoms increase over the weekend prior to culture results. Instructed to call for culture results.

## 2012-07-21 NOTE — Patient Instructions (Signed)
Urinary Tract Infection Urinary tract infections (UTIs) can develop anywhere along your urinary tract. Your urinary tract is your body's drainage system for removing wastes and extra water. Your urinary tract includes two kidneys, two ureters, a bladder, and a urethra. Your kidneys are a pair of bean-shaped organs. Each kidney is about the size of your fist. They are located below your ribs, one on each side of your spine. CAUSES Infections are caused by microbes, which are microscopic organisms, including fungi, viruses, and bacteria. These organisms are so small that they can only be seen through a microscope. Bacteria are the microbes that most commonly cause UTIs. SYMPTOMS  Symptoms of UTIs may vary by age and gender of the patient and by the location of the infection. Symptoms in [name] women typically include a frequent and intense urge to urinate and a painful, burning feeling in the bladder or urethra during urination. Older women and men are more likely to be tired, shaky, and weak and have muscle aches and abdominal pain. A fever may mean the infection is in your kidneys. Other symptoms of a kidney infection include pain in your back or sides below the ribs, nausea, and vomiting. DIAGNOSIS To diagnose a UTI, your caregiver will ask you about your symptoms. Your caregiver also will ask to provide a urine sample. The urine sample will be tested for bacteria and white blood cells. White blood cells are made by your body to help fight infection. TREATMENT  Typically, UTIs can be treated with medication. Because most UTIs are caused by a bacterial infection, they usually can be treated with the use of antibiotics. The choice of antibiotic and length of treatment depend on your symptoms and the type of bacteria causing your infection. HOME CARE INSTRUCTIONS  If you were prescribed antibiotics, take them exactly as your caregiver instructs you. Finish the medication even if you feel better after you  have only taken some of the medication.  Drink enough water and fluids to keep your urine clear or pale yellow.  Avoid caffeine, tea, and carbonated beverages. They tend to irritate your bladder.  Empty your bladder often. Avoid holding urine for long periods of time.  Empty your bladder before and after sexual intercourse.  After a bowel movement, women should cleanse from front to back. Use each tissue only once. SEEK MEDICAL CARE IF:   You have back pain.  You develop a fever.  Your symptoms do not begin to resolve within 3 days. SEEK IMMEDIATE MEDICAL CARE IF:   You have severe back pain or lower abdominal pain.  You develop chills.  You have nausea or vomiting.  You have continued burning or discomfort with urination. MAKE SURE YOU:   Understand these instructions.  Will watch your condition.  Will get help right away if you are not doing well or get worse. Document Released: 07/01/2005 Document Revised: 03/22/2012 Document Reviewed: 10/30/2011 Exit[employer] Patient Information 2013 [employer].  

## 2013-09-14 ENCOUNTER — Ambulatory Visit (INDEPENDENT_AMBULATORY_CARE_PROVIDER_SITE_OTHER): Payer: BC Managed Care – PPO | Admitting: Gynecology

## 2013-09-14 ENCOUNTER — Other Ambulatory Visit (HOSPITAL_COMMUNITY)
Admission: RE | Admit: 2013-09-14 | Discharge: 2013-09-14 | Disposition: A | Discharge status: Home / Self Care | Payer: BC Managed Care – PPO | Source: Ambulatory Visit | Attending: Gynecology | Admitting: Gynecology

## 2013-09-14 ENCOUNTER — Encounter: Payer: Self-pay | Admitting: Gynecology

## 2013-09-14 VITALS — BP 120/76 | Ht 65.0 in | Wt 200.0 lb

## 2013-09-14 DIAGNOSIS — Z01419 Encounter for gynecological examination (general) (routine) without abnormal findings: Secondary | ICD-10-CM

## 2013-09-14 NOTE — Patient Instructions (Signed)
Followup in one year for annual exam, sooner if any issues 

## 2013-09-14 NOTE — Progress Notes (Signed)
ELLIOTTE MARSALIS 01/24/1981 [id number]        32 y.o.  [id number] for annual exam.  Doing well without complaints. Former patient of Dr. Eda Paschal.  Past medical history,surgical history, problem list, medications, allergies, family history and social history were all reviewed and documented in the EPIC chart.  ROS:  Performed and pertinent positives and negatives are included in the history, assessment and plan .  Exam: Kim assistant Filed Vitals:   09/14/13 0951  BP: 120/76  Height: 5\' 5"  (1.651 m)  Weight: 200 lb (90.719 kg)   General appearance  Normal Skin grossly normal Head/Neck normal with no cervical or supraclavicular adenopathy thyroid normal Lungs  clear Cardiac RR, without RMG Abdominal  soft, nontender, without masses, organomegaly or hernia Breasts  examined lying and sitting without masses, retractions, discharge or axillary adenopathy. Pelvic  Ext/BUS/vagina  Normal, Pap of cuff done  Adnexa  Without masses or tenderness    Anus and perineum  Normal   Rectovaginal  Normal sphincter tone without palpated masses or tenderness.    Assessment/Plan:  32 y.o. [id number] female for annual exam.   1. History LAVH, right salpingectomy, lysis of adhesions for endometriosis 2010. Doing well without complaints. Will continue to monitor. 2. Pap smear 2010. Pap smear done today. I reviewed current screening guidelines and options to stop screening altogether reviewed. No history of abnormal Pap smears and status post hysterectomy for benign indications. Will readdress on an annual basis. 3. Breast health. SBE monthly reviewed. No family history of breast cancer. 4. Health maintenance. No blood work done as it is all done through her primary physician's office. Followup one year, sooner as needed.   Note: This document was prepared with digital dictation and possible smart phrase technology. Any transcriptional errors that result from this process are  unintentional.   Dara Lords MD, 10:11 AM 09/14/2013

## 2013-09-14 NOTE — Addendum Note (Signed)
Addended by: Dayna Barker on: 09/14/2013 10:50 AM   Modules accepted: Orders

## 2013-09-15 ENCOUNTER — Other Ambulatory Visit: Payer: Self-pay | Admitting: Gynecology

## 2013-09-15 DIAGNOSIS — R81 Glycosuria: Secondary | ICD-10-CM

## 2013-09-15 LAB — URINALYSIS W MICROSCOPIC + REFLEX CULTURE
Glucose, UA: >1000 mg/dL — AB
Hgb urine dipstick: NEGATIVE
Leukocytes, UA: NEGATIVE
Nitrite: NEGATIVE
Protein, ur: NEGATIVE mg/dL
pH: 6 (ref 5.0–8.0)

## 2013-09-16 LAB — URINE CULTURE: Colony Count: 4000

## 2013-09-18 ENCOUNTER — Other Ambulatory Visit: Payer: BC Managed Care – PPO

## 2013-09-18 DIAGNOSIS — R81 Glycosuria: Secondary | ICD-10-CM

## 2013-09-18 LAB — HEMOGLOBIN A1C
Hgb A1c MFr Bld: 7.4 % — ABNORMAL HIGH (ref <5.7)
Mean Plasma Glucose: 166 mg/dL — ABNORMAL HIGH (ref <117)

## 2013-09-19 ENCOUNTER — Telehealth: Payer: Self-pay | Admitting: *Deleted

## 2013-09-19 DIAGNOSIS — R7309 Other abnormal glucose: Secondary | ICD-10-CM

## 2013-09-19 NOTE — Telephone Encounter (Signed)
Appointment on 09/25/13 @ 2:00 pm with Dr.Kumar at [facility] left this information on pt voicemail and asked her to call me to confirm she received my message regarding the above.

## 2013-09-19 NOTE — Telephone Encounter (Signed)
Pt received message.

## 2013-09-19 NOTE — Telephone Encounter (Signed)
Message copied by Aura Camps on Tue Sep 19, 2013 10:19 AM ------      Message from: Keenan Bachelor      Created: Tue Sep 19, 2013  9:41 AM      Regarding: referral to Endocrinologist       Redgie Grayer,      This is patient with new diabetes diagnosis and 7.4 hgb A1C. Looks like Dr. Velvet Bathe leaves it open who she sees so if someone else has sooner appt than Dr. Talmage Nap that would be best. I already spoke with patient and she knows she will hear from you.            "Tell patient that her glucose labs are consistent with diabetes. Patient needs appointment to see endocrinologist such as Dr. Talmage Nap."            Thank you!!!!!!!!!!!!!! ------

## 2013-09-25 ENCOUNTER — Encounter: Payer: Self-pay | Admitting: Endocrinology

## 2013-09-25 ENCOUNTER — Ambulatory Visit (INDEPENDENT_AMBULATORY_CARE_PROVIDER_SITE_OTHER): Payer: BC Managed Care – PPO | Admitting: Endocrinology

## 2013-09-25 VITALS — BP 122/82 | HR 83 | Temp 98.2°F | Resp 12 | Ht 65.0 in | Wt 202.4 lb

## 2013-09-25 DIAGNOSIS — R945 Abnormal results of liver function studies: Secondary | ICD-10-CM

## 2013-09-25 DIAGNOSIS — K769 Liver disease, unspecified: Secondary | ICD-10-CM

## 2013-09-25 DIAGNOSIS — E119 Type 2 diabetes mellitus without complications: Secondary | ICD-10-CM | POA: Insufficient documentation

## 2013-09-25 DIAGNOSIS — E669 Obesity, unspecified: Secondary | ICD-10-CM

## 2013-09-25 LAB — COMPREHENSIVE METABOLIC PANEL
ALT: 60 U/L — ABNORMAL HIGH (ref 0–35)
Albumin: 4.1 g/dL (ref 3.5–5.2)
Alkaline Phosphatase: 94 U/L (ref 39–117)
CO2: 27 mEq/L (ref 19–32)
Calcium: 9 mg/dL (ref 8.4–10.5)
Chloride: 100 mEq/L (ref 96–112)
GFR: 143.74 mL/min (ref >60.00)
Sodium: 136 mEq/L (ref 135–145)
Total Protein: 7.7 g/dL (ref 6.0–8.3)

## 2013-09-25 LAB — MICROALBUMIN / CREATININE URINE RATIO: Creatinine,U: 110.4 mg/dL

## 2013-09-25 MED ORDER — METFORMIN HCL ER (OSM) 500 MG PO TB24: 1000.0000 mg | ORAL_TABLET | Freq: Every day | ORAL | Status: DC

## 2013-09-25 NOTE — Progress Notes (Signed)
Patient ID: [name], female   DOB: March 28, 1981, 32 y.o.   MRN: [id number]  Reason for Appointment : Consultation for Type 2 Diabetes  History of Present Illness          Diagnosis: Type 2 diabetes mellitus, date of diagnosis: 09/2013        Past history: She has a history of testicular diabetes with both her children, her youngest child is 75 years old She was treated with diet alone. She had seen a dietitian during her first pregnancy and was monitoring her blood sugars during both pregnancies Did not have any followup subsequently  Recent history:   On her annual exam with her gynecologist recently she had glycosuria and was evaluated with glucose and A1c levels. Her fasting glucose was 151 and A1c was high. She has not had any increased thirst or frequent urination, no blurred vision She has gained about 25 pounds in the last year She is now referred here for further management  Glycemic control:  Lab Results  Component Value Date   HGBA1C 7.4* 09/18/2013   Lab Results  Component Value Date   CREATININE 0.63 12/30/2008    Self-care: The diet that the patient has been following is none  She is eating a biscuit in the morning for breakfast, sandwich or salad at lunch, pasta at supper and snacks with fruit and pretzels    Meals: 3 meals per day.          Exercise:  walking about 2 days a week, 20-30 minutes                   Retinal exam: Most recent: Unknown  Weight history: Filed Weights   09/25/13 1411  Weight: 202 lb 6.4 oz (91.808 kg)      Medication List       This list is accurate as of: 09/25/13  2:34 PM.  Always use your most recent med list.               doxepin 10 MG capsule  Commonly known as:  SINEQUAN  Take 10 mg by mouth.     escitalopram 10 MG tablet  Commonly known as:  LEXAPRO  Take 20 mg by mouth daily.        Allergies: No Known Allergies  Past Medical History  Diagnosis Date  . Endometriosis   . Ovarian cyst   . Diabetes  mellitus without complication     Past Surgical History  Procedure Laterality Date  . Pelvic laparoscopy      DIAG LAP W LASER OF ENDOMETRIOSIS  . Dilation and curettage of uterus  2005  . Cesarean section      X2  . Vaginal hysterectomy  05/10/09    LAVH LYSIS OF ADHESIONS, RIGHT SALPINECTOMY    Family History  Problem Relation Age of Onset  . Diabetes Father   . Stroke Father   . Diabetes Paternal Grandmother   . Diabetes Paternal Grandfather     Social History:  reports that she has quit smoking. She does not have any smokeless tobacco history on file. She reports that she drinks alcohol. She reports that she does not use illicit drugs.    Review of Systems       Lipids: Done by PCP in 12/13 ? level      No unusual headaches.                  Skin: No rash or infections  Thyroid:  No  unusual fatigue.     The blood pressure has been normal in the past     No swelling of feet.     No shortness of breath on exertion.     Bowel habits: Normal.         No history of Numbness, tingling or burning in  feet    She has been taking Lexapro for anxiety and doxepin for insomnia   LABS:  No visits with results within 1 Week(s) from this visit. Latest known visit with results is:  Appointment on 09/18/2013  Component Date Value Range Status  . Glucose, Fasting 09/18/2013 151* 70 - 99 mg/dL Final   Comment:                              < 100  mg/dL = normal fasting glucose                          100-125  mg/dL = IFG (impaired fasting glucose)                            > 125  mg/dL = provisional diagnosis of diabetes                             . Hemoglobin A1C 09/18/2013 7.4* <5.7 % Final   Comment:                                                                                                 According to the ADA Clinical Practice Recommendations for 2011, when                          HbA1c is used as a screening test:                                                        >=6.5%   Diagnostic of Diabetes Mellitus                                     (if abnormal result is confirmed)                                                     5.7-6.4%   Increased risk of developing Diabetes Mellitus  References:Diagnosis and Classification of Diabetes Mellitus,Diabetes                          Care,2011,34(Suppl 1):S62-S69 and Standards of Medical Care in                                  Diabetes - 2011,Diabetes Care,2011,34 (Suppl 1):S11-S61.                             . Mean Plasma Glucose 09/18/2013 166* <117 mg/dL Final    Physical Examination:  BP 122/82  Pulse 83  Temp(Src) 98.2 F (36.8 C)  Resp 12  Ht 5\' 5"  (1.651 m)  Wt 202 lb 6.4 oz (91.808 kg)  BMI 33.68 kg/m2  SpO2 96%  GENERAL:         Patient has generalized obesity.   HEENT:         Eye exam shows normal external appearance. Fundus exam shows no retinopathy. Oral exam shows normal mucosa .  NECK:  no cushingoid features or acanthosis present. There is no lymphadenopathy.   Thyroid is not enlarged and no nodules felt.   LUNGS:         Chest is symmetrical. Lungs are clear to auscultation.Marland Kitchen   HEART:         Heart sounds:  S1 and S2 are normal. No murmurs or clicks heard., no S3 or S4.   ABDOMEN:         General:  There is no distention present. Liver and spleen are not palpable. No other mass or tenderness present.  EXTREMITIES:     There is no edema. No skin lesions present.Marland Kitchen  NEUROLOGICAL:        Vibration sense is minimally reduced in toes. Ankle jerks are 1+ bilaterally, biceps reflexes normal.          Diabetic foot exam:  as in the foot exam section MUSCULOSKELETAL:       There is no enlargement or deformity of the joints. Spine is normal to inspection.Marland Kitchen   PEDAL pulses: Normal SKIN:       No rash or lesions of concern.        ASSESSMENT:  Diabetes type 2, newly diagnosed  Patient has a background history of  gestational diabetes and strong family history of type 2 diabetes Currently has mildly asymptomatic diabetes with fasting glucose 151 and A1c 7.4 Discussed general information about causative factors involved in type 2 diabetes including insulin resistance and progressive beta cell failure Discussed importance of lifestyle changes to achieve at least a modest weight loss for short-term and long-term control  She is a good candidate for metformin since this will help with insulin resistance, potentiate weight loss with lifestyle changes and improve hyperglycemia more quickly. Discussed how this works, doses titration, possible side effects She will start with 500 mg daily of metformin ER and go up to 1000 mg daily if no side effects in one week. Consider 1500 mg on the next visit if glucose not adequately controlled  Complications: None  PLAN:   She will start glucose monitoring at home at least every other day alternating fasting and postprandial Have given her a One Touch Verio glucose monitoring showed her how to use it Increase walking to at least 40 minutes 4 times a week Start heart healthy diet, information  given Consultation with nurse educator and dietitian Establish baseline lipid levels on next visit with fasting check Periodic eye exams  Arli Bree 09/25/2013, 2:34 PM   Addendum: Mild increase in liver functions, possibly hepatic steatosis, will forward information to PCP  Office Visit on 09/25/2013  Component Date Value Range Status  . Sodium 09/25/2013 136  135 - 145 mEq/L Final  . Potassium 09/25/2013 4.3  3.5 - 5.1 mEq/L Final  . Chloride 09/25/2013 100  96 - 112 mEq/L Final  . CO2 09/25/2013 27  19 - 32 mEq/L Final  . Glucose, Bld 09/25/2013 135* 70 - 99 mg/dL Final  . BUN 16/07/9603 8  6 - 23 mg/dL Final  . Creatinine, Ser 09/25/2013 0.5  0.4 - 1.2 mg/dL Final  . Total Bilirubin 09/25/2013 0.3  0.3 - 1.2 mg/dL Final  . Alkaline Phosphatase 09/25/2013 94  39 - 117  U/L Final  . AST 09/25/2013 62* 0 - 37 U/L Final  . ALT 09/25/2013 60* 0 - 35 U/L Final  . Total Protein 09/25/2013 7.7  6.0 - 8.3 g/dL Final  . Albumin 54/06/8118 4.1  3.5 - 5.2 g/dL Final  . Calcium 14/78/2956 9.0  8.4 - 10.5 mg/dL Final  . GFR 21/30/8657 143.74  >60.00 mL/min Final  . Microalb, Ur 09/25/2013 0.9  0.0 - 1.9 mg/dL Final  . Creatinine,U 84/69/6295 110.4   Final  . Microalb Creat Ratio 09/25/2013 0.8  0.0 - 30.0 mg/g Final

## 2013-09-25 NOTE — Patient Instructions (Signed)
Please check blood sugars at least half the time about 2 hours after any meal and on other days on waking up. Test blood sugar once a day or less  Please bring blood sugar monitor to each visit  Increase brisk walking to at least 3-4 times a week, 30-40 minutes  Watch foods with large amounts of carbohydrate and fats, use only unsweetened drinks  Metformin ER 500 mg with supper daily for about a week and then 2 tablets daily if having no nausea or diarrhea

## 2013-09-26 DIAGNOSIS — E669 Obesity, unspecified: Secondary | ICD-10-CM | POA: Insufficient documentation

## 2013-09-29 ENCOUNTER — Other Ambulatory Visit: Payer: Self-pay | Admitting: *Deleted

## 2013-09-29 MED ORDER — ONETOUCH DELICA LANCETS FINE MISC: Status: DC

## 2013-09-29 MED ORDER — METFORMIN HCL ER (OSM) 500 MG PO TB24: 1000.0000 mg | ORAL_TABLET | Freq: Every day | ORAL | Status: DC

## 2013-09-29 MED ORDER — GLUCOSE BLOOD VI STRP: ORAL_STRIP | Status: DC

## 2013-10-02 ENCOUNTER — Other Ambulatory Visit: Payer: Self-pay | Admitting: *Deleted

## 2013-10-03 ENCOUNTER — Other Ambulatory Visit: Payer: Self-pay | Admitting: *Deleted

## 2013-10-03 MED ORDER — GLUCOSE BLOOD VI STRP: ORAL_STRIP | Status: DC

## 2013-10-16 ENCOUNTER — Other Ambulatory Visit: Payer: Self-pay | Admitting: *Deleted

## 2013-10-16 MED ORDER — ONETOUCH DELICA LANCETS FINE MISC: Status: DC

## 2013-10-16 MED ORDER — GLUCOSE BLOOD VI STRP: ORAL_STRIP | Status: DC

## 2013-11-06 ENCOUNTER — Other Ambulatory Visit (INDEPENDENT_AMBULATORY_CARE_PROVIDER_SITE_OTHER): Payer: BC Managed Care – PPO

## 2013-11-06 DIAGNOSIS — E119 Type 2 diabetes mellitus without complications: Secondary | ICD-10-CM

## 2013-11-06 LAB — LIPID PANEL
CHOLESTEROL: 155 mg/dL (ref 0–200)
HDL: 37.5 mg/dL — ABNORMAL LOW (ref >39.00)
LDL Cholesterol: 92 mg/dL (ref 0–99)
Total CHOL/HDL Ratio: 4
Triglycerides: 130 mg/dL (ref 0.0–149.0)
VLDL: 26 mg/dL (ref 0.0–40.0)

## 2013-11-06 LAB — GLUCOSE, RANDOM: GLUCOSE: 106 mg/dL — AB (ref 70–99)

## 2013-11-07 LAB — FRUCTOSAMINE: Fructosamine: 231 umol/L (ref <285)

## 2013-11-08 ENCOUNTER — Encounter: Payer: BC Managed Care – PPO | Attending: Endocrinology | Admitting: *Deleted

## 2013-11-08 ENCOUNTER — Encounter: Payer: Self-pay | Admitting: *Deleted

## 2013-11-08 VITALS — Ht 64.0 in | Wt 195.8 lb

## 2013-11-08 DIAGNOSIS — E119 Type 2 diabetes mellitus without complications: Secondary | ICD-10-CM

## 2013-11-08 DIAGNOSIS — Z713 Dietary counseling and surveillance: Secondary | ICD-10-CM | POA: Insufficient documentation

## 2013-11-08 DIAGNOSIS — E669 Obesity, unspecified: Secondary | ICD-10-CM

## 2013-11-08 NOTE — Progress Notes (Signed)
Appt start time: 1500 end time:  1630.  Assessment:  Patient was seen on  11/08/13 for individual diabetes education. Newly diagnosed except for GDM with both pregnancies. She has Verio IQ meter and is testing once a day at various times of day. Reported range is 70 - 245 mg/dl. 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 She shops and cooks the family's meals. No set activity schedule right now. She has an APP on her phone that tracks her food intake called FOODUCATE which she attributes her weight loss success to.  Current HbA1c: 7.4% on 09/18/13  Preferred Learning Style:   Visual  Learning Readiness:   Ready  Change in progress  MEDICATIONS: see list. Diabetes medication is Metformin  DIETARY INTAKE:  24-hr recall:  B ( AM): Pop Tart OR Smart Ones breakfast burrito, water and diet soda to drink  Snk ( AM): no  L ( PM): brings or goes home for lunch: left overs OR sandwich, fresh fruit, water Snk ( PM): almonds at work and / or fresh fruit D ( PM): meat, starch, vegetable OR occasionally salad dinner meal, water Snk ( PM): no Beverages: water  Usual physical activity: not except for house work  Estimated energy needs: 1400 calories 158 g carbohydrates 105 g protein 39 g fat    Intervention:  Nutrition counseling provided.  Discussed diabetes disease process and treatment options.  Discussed physiology of diabetes and role of obesity on insulin resistance.  Encouraged moderate weight reduction to improve glucose levels.  Discussed role of medications and diet in glucose control  Provided education on macronutrients on glucose levels.  Provided education on carb counting, importance of regularly scheduled meals/snacks, and meal planning  Discussed effects of physical activity on glucose levels and long-term glucose control.  Recommended 150 minutes of physical activity/week.  Reviewed patient medications.  Discussed role of  medication on blood glucose and possible side effects  Discussed blood glucose monitoring and interpretation.  Discussed recommended target ranges and individual ranges.    Described short-term complications: hyper- and hypo-glycemia.  Discussed causes,symptoms, and treatment options.  Discussed prevention, detection, and treatment of long-term complications.  Discussed the role of prolonged elevated glucose levels on body systems.  Discussed role of stress on blood glucose levels and discussed strategies to manage psychosocial issues.  Discussed recommendations for long-term diabetes self-care.  Established checklist for medical, dental, and emotional self-care.  Plan:  Aim for 2-3 Carb Choices per meal (30-45 grams) +/- 1 either way  Aim for 0-1 Carbs per snack if hungry  Include protein as needed for appetite control Continue reading food labels for Total Carbohydrate of foods Consider  increasing your activity level by walking for 15-30 minutes daily as tolerated Continuer checking BG at alternate times per day as directed by MD  GREAT JOB!  Teaching Method Utilized: Visual, Auditory and Hands on  Handouts given during visit include: Living Well with Diabetes Carb Counting and Food Label handouts Meal Plan Card  Barriers to learning/adherence to lifestyle change: none  Diabetes self-care support plan:   [facility] support group  Demonstrated degree of understanding via:  Teach Back   Monitoring/Evaluation:  Dietary intake, exercise, reading food labels, and body weight prn.

## 2013-11-08 NOTE — Patient Instructions (Signed)
Plan:  Aim for 2-3 Carb Choices per meal (30-45 grams) +/- 1 either way  Aim for 0-1 Carbs per snack if hungry  Include protein as needed for appetite control Continue reading food labels for Total Carbohydrate of foods Consider  increasing your activity level by walking for 15-30 minutes daily as tolerated Continuer checking BG at alternate times per day as directed by MD  GREAT JOB!

## 2013-11-09 ENCOUNTER — Ambulatory Visit: Payer: BC Managed Care – PPO | Admitting: Endocrinology

## 2013-11-16 ENCOUNTER — Ambulatory Visit (INDEPENDENT_AMBULATORY_CARE_PROVIDER_SITE_OTHER): Payer: BC Managed Care – PPO | Admitting: Endocrinology

## 2013-11-16 ENCOUNTER — Encounter: Payer: Self-pay | Admitting: Endocrinology

## 2013-11-16 VITALS — BP 118/82 | HR 84 | Temp 97.9°F | Resp 14 | Ht 65.0 in | Wt 195.8 lb

## 2013-11-16 DIAGNOSIS — R945 Abnormal results of liver function studies: Secondary | ICD-10-CM

## 2013-11-16 DIAGNOSIS — R7989 Other specified abnormal findings of blood chemistry: Secondary | ICD-10-CM

## 2013-11-16 DIAGNOSIS — E119 Type 2 diabetes mellitus without complications: Secondary | ICD-10-CM

## 2013-11-16 DIAGNOSIS — E786 Lipoprotein deficiency: Secondary | ICD-10-CM

## 2013-11-16 NOTE — Progress Notes (Signed)
Patient ID: [name], female   DOB: [birth year], 33 y.o.   MRN: [id number]   Reason for Appointment : Followup for Type 2 Diabetes  History of Present Illness          Diagnosis: Type 2 diabetes mellitus, date of diagnosis: 09/2013        Past history: She has a history of gestational diabetes with both her children, her youngest child is 22 years old She was treated with diet alone. She had seen a dietitian during her first pregnancy and was monitoring her blood sugars during both pregnancies Did not have any followup subsequently  Recent history:   On her annual exam with her gynecologist she was found to have diabetes with a fasting glucose was 151 and A1c 7.4. She has gained about 25 pounds in 2014 Since her diagnosis she has been better with regular exercise Also started improving her diet with reduced calories, fats and carbohydrates She was started on metformin ER 500 mg and she has increased this to 1000 mg a day without side effects Also has started using a FreeStyle monitor with the following results:  PREMEAL Breakfast Lunch Dinner Bedtime Overall  Glucose range:  114    112   117    Mean/median:      146    POST-MEAL PC Breakfast PC Lunch PC Dinner  Glucose range:  106- 246   101, 152   82-198   Mean/median:      Glycemic control:  Lab Results  Component Value Date   HGBA1C 7.4* 09/18/2013   Lab Results  Component Value Date   MICROALBUR 0.9 09/25/2013   LDLCALC 92 11/06/2013   CREATININE 0.5 09/25/2013    Self-care: The diet that the patient has been following is low fat  She is eating a [place], egg whites for breakfast, occasionally pop tart; has a sandwich or salad at lunch, pasta at supper and snacks with fruit and pretzels    Meals: 3 meals per day.          Exercise:  walking about 2 days a week, 20-30 minutes       Diabetes education was done on 11/08/13            Retinal exam: Most recent: Unknown  Weight history:  Wt Readings from Last 3  Encounters:  11/16/13 195 lb 12.8 oz (88.814 kg)  11/08/13 195 lb 12.8 oz (88.814 kg)  09/25/13 202 lb 6.4 oz (91.808 kg)      Medication List       This list is accurate as of: 11/16/13  1:19 PM.  Always use your most recent med list.               doxepin 10 MG capsule  Commonly known as:  SINEQUAN  Take 10 mg by mouth.     escitalopram 10 MG tablet  Commonly known as:  LEXAPRO  Take 20 mg by mouth daily.     glucose blood test strip  Commonly known as:  FREESTYLE LITE  Use as instructed to check blood sugars once a day     metformin 500 MG (OSM) 24 hr tablet  Commonly known as:  FORTAMET  Take 2 tablets (1,000 mg total) by mouth daily with breakfast.     ONETOUCH DELICA LANCETS FINE Misc  Use to check blood sugar once a day        Allergies: No Known Allergies  Past Medical History  Diagnosis Date  .  Endometriosis   . Ovarian cyst   . Diabetes mellitus without complication     Past Surgical History  Procedure Laterality Date  . Pelvic laparoscopy      DIAG LAP W LASER OF ENDOMETRIOSIS  . Dilation and curettage of uterus  2005  . Cesarean section      X2  . Vaginal hysterectomy  05/10/09    LAVH LYSIS OF ADHESIONS, RIGHT SALPINECTOMY    Family History  Problem Relation Age of Onset  . Diabetes Father   . Stroke Father   . Diabetes Paternal Grandmother   . Diabetes Paternal Grandfather     Social History:  reports that she has quit smoking. She does not have any smokeless tobacco history on file. She reports that she drinks alcohol. She reports that she does not use illicit drugs.    Review of Systems       Lipids: LDL 92, HDL 38      She has been taking Lexapro for anxiety and doxepin for insomnia   LABS:  No visits with results within 1 Week(s) from this visit. Latest known visit with results is:  Appointment on 11/06/2013  Component Date Value Ref Range Status  . Cholesterol 11/06/2013 155  0 - 200 mg/dL Final   ATP III  Classification       Desirable:  < 200 mg/dL               Borderline High:  200 - 239 mg/dL          High:  > = 240 mg/dL  . Triglycerides 11/06/2013 130.0  0.0 - 149.0 mg/dL Final   Normal:  <150 mg/dLBorderline High:  150 - 199 mg/dL  . HDL 11/06/2013 37.50* >39.00 mg/dL Final  . VLDL 11/06/2013 26.0  0.0 - 40.0 mg/dL Final  . LDL Cholesterol 11/06/2013 92  0 - 99 mg/dL Final  . Total CHOL/HDL Ratio 11/06/2013 4   Final                  Men          Women1/2 Average Risk     3.4          3.3Average Risk          5.0          4.42X Average Risk          9.6          7.13X Average Risk          15.0          11.0                      . Glucose, Bld 11/06/2013 106* 70 - 99 mg/dL Final  . Fructosamine 11/06/2013 231  <285 umol/L Final   Comment:                            Variations in levels of serum proteins (albumin and immunoglobulins)                          may affect fructosamine results.                               Physical Examination:  BP 118/82  Pulse 84  Temp(Src) 97.9 F (36.6 C)  Resp 14  Ht 5\' 5"  (1.651 m)  Wt 195 lb 12.8 oz (88.814 kg)  BMI 32.58 kg/m2  SpO2 96%  ASSESSMENT:  Diabetes type 2, recently diagnosed  Patient has improving blood sugars and weight with starting diet and exercise regimen as well as metformin. Her glucose readings are approaching normal and she has done well with some postprandial monitoring to help modify her diet especially at breakfast Fructosamine is low normal  Mildly abnormal liver functions, likely to be fatty liver from insulin resistance  PLAN:   She will continue her current regimen and come back in 2 months for followup A1c Consider adding Actos if still having abnormal liver functions  Lasaro Primm 11/16/2013, 1:19 PM

## 2013-11-18 DIAGNOSIS — E786 Lipoprotein deficiency: Secondary | ICD-10-CM | POA: Insufficient documentation

## 2013-11-18 DIAGNOSIS — R7989 Other specified abnormal findings of blood chemistry: Secondary | ICD-10-CM | POA: Insufficient documentation

## 2013-11-18 DIAGNOSIS — R945 Abnormal results of liver function studies: Secondary | ICD-10-CM | POA: Insufficient documentation

## 2014-01-15 ENCOUNTER — Other Ambulatory Visit (INDEPENDENT_AMBULATORY_CARE_PROVIDER_SITE_OTHER): Payer: BC Managed Care – PPO

## 2014-01-15 DIAGNOSIS — E119 Type 2 diabetes mellitus without complications: Secondary | ICD-10-CM

## 2014-01-15 LAB — COMPREHENSIVE METABOLIC PANEL
ALBUMIN: 3.9 g/dL (ref 3.5–5.2)
ALT: 40 U/L — AB (ref 0–35)
AST: 41 U/L — AB (ref 0–37)
Alkaline Phosphatase: 89 U/L (ref 39–117)
BUN: 8 mg/dL (ref 6–23)
CALCIUM: 9.2 mg/dL (ref 8.4–10.5)
CHLORIDE: 102 meq/L (ref 96–112)
CO2: 26 mEq/L (ref 19–32)
Creatinine, Ser: 0.6 mg/dL (ref 0.4–1.2)
GFR: 119.33 mL/min (ref >60.00)
Glucose, Bld: 102 mg/dL — ABNORMAL HIGH (ref 70–99)
POTASSIUM: 4.1 meq/L (ref 3.5–5.1)
SODIUM: 137 meq/L (ref 135–145)
Total Bilirubin: 0.5 mg/dL (ref 0.3–1.2)
Total Protein: 7.4 g/dL (ref 6.0–8.3)

## 2014-01-15 LAB — HEMOGLOBIN A1C: HEMOGLOBIN A1C: 6.3 % (ref 4.6–6.5)

## 2014-01-17 ENCOUNTER — Encounter: Payer: Self-pay | Admitting: Endocrinology

## 2014-01-17 ENCOUNTER — Ambulatory Visit (INDEPENDENT_AMBULATORY_CARE_PROVIDER_SITE_OTHER): Payer: BC Managed Care – PPO | Admitting: Endocrinology

## 2014-01-17 VITALS — BP 122/74 | HR 99 | Temp 97.7°F | Resp 14 | Ht 65.0 in | Wt 189.8 lb

## 2014-01-17 DIAGNOSIS — E119 Type 2 diabetes mellitus without complications: Secondary | ICD-10-CM

## 2014-01-17 DIAGNOSIS — E786 Lipoprotein deficiency: Secondary | ICD-10-CM

## 2014-01-17 NOTE — Progress Notes (Signed)
Patient ID: [name], female   DOB: [birth year], 33 y.o.   MRN: [id number]   Reason for Appointment : Followup for Type 2 Diabetes  History of Present Illness          Diagnosis: Type 2 diabetes mellitus, date of diagnosis: 09/2013        Past history: She has a history of gestational diabetes with both her children, her youngest child is 68 years old She was treated with diet alone. She had seen a dietitian during her first pregnancy and was monitoring her blood sugars during both pregnancies On her annual exam with her gynecologist she was found to have diabetes with a fasting glucose was 151 and A1c 7.4. She gained about 25 pounds in 2014  Recent history:  She has been treating with metformin ER, 1000 mg a day Also has been able to be quite regular with exercise as well as improving weight Her diet is better with reduced calories, fats and carbohydrates Her A1c is now upper normal She is using a FreeStyle monitor with the following results:  PREMEAL Breakfast  PC Lunch Dinner  PCS  Overall  Glucose range:  96, 127   160   99-142   89, 140    Mean/median:      108    Glycemic control:  Lab Results  Component Value Date   HGBA1C 6.3 01/15/2014   HGBA1C 7.4* 09/18/2013   Lab Results  Component Value Date   MICROALBUR 0.9 09/25/2013   LDLCALC 92 11/06/2013   CREATININE 0.6 01/15/2014    Self-care: The diet that the patient has been following is low fat  She is eating a [place], egg whites for breakfast; has a sandwich or salad at lunch, pasta at supper and snacks with fruit and pretzels    Meals: 3 meals per day.          Exercise:  walking 5 week, 20-30 minutes       Diabetes education was done on 11/08/13            Retinal exam: Most recent: Unknown  Weight history:  Wt Readings from Last 3 Encounters:  01/17/14 189 lb 12.8 oz (86.093 kg)  11/16/13 195 lb 12.8 oz (88.814 kg)  11/08/13 195 lb 12.8 oz (88.814 kg)      Medication List       This list is accurate as  of: 01/17/14  3:39 PM.  Always use your most recent med list.               doxepin 10 MG capsule  Commonly known as:  SINEQUAN  Take 10 mg by mouth.     escitalopram 10 MG tablet  Commonly known as:  LEXAPRO  Take 20 mg by mouth daily.     glucose blood test strip  Commonly known as:  FREESTYLE LITE  Use as instructed to check blood sugars once a day     metformin 500 MG (OSM) 24 hr tablet  Commonly known as:  FORTAMET  Take 2 tablets (1,000 mg total) by mouth daily with breakfast.     ONETOUCH DELICA LANCETS FINE Misc  Use to check blood sugar once a day     zolpidem 5 MG tablet  Commonly known as:  AMBIEN  Take 5 mg by mouth at bedtime as needed for sleep.        Allergies: No Known Allergies  Past Medical History  Diagnosis Date  . Endometriosis   .  Ovarian cyst   . Diabetes mellitus without complication     Past Surgical History  Procedure Laterality Date  . Pelvic laparoscopy      DIAG LAP W LASER OF ENDOMETRIOSIS  . Dilation and curettage of uterus  2005  . Cesarean section      X2  . Vaginal hysterectomy  05/10/09    LAVH LYSIS OF ADHESIONS, RIGHT SALPINECTOMY    Family History  Problem Relation Age of Onset  . Diabetes Father   . Stroke Father   . Diabetes Paternal Grandmother   . Diabetes Paternal Grandfather     Social History:  reports that she has quit smoking. She does not have any smokeless tobacco history on file. She reports that she drinks alcohol. She reports that she does not use illicit drugs.    Review of Systems       Lipids: LDL 92, HDL 38      She has been taking Lexapro for anxiety and doxepin for insomnia   LABS:  Appointment on 01/15/2014  Component Date Value Ref Range Status  . Hemoglobin A1C 01/15/2014 6.3  4.6 - 6.5 % Final   Glycemic Control Guidelines for People with Diabetes:Non Diabetic:  <6%Goal of Therapy: <7%Additional Action Suggested:  >8%   . Sodium 01/15/2014 137  135 - 145 mEq/L Final  . Potassium  01/15/2014 4.1  3.5 - 5.1 mEq/L Final  . Chloride 01/15/2014 102  96 - 112 mEq/L Final  . CO2 01/15/2014 26  19 - 32 mEq/L Final  . Glucose, Bld 01/15/2014 102* 70 - 99 mg/dL Final  . BUN 01/15/2014 8  6 - 23 mg/dL Final  . Creatinine, Ser 01/15/2014 0.6  0.4 - 1.2 mg/dL Final  . Total Bilirubin 01/15/2014 0.5  0.3 - 1.2 mg/dL Final  . Alkaline Phosphatase 01/15/2014 89  39 - 117 U/L Final  . AST 01/15/2014 41* 0 - 37 U/L Final  . ALT 01/15/2014 40* 0 - 35 U/L Final  . Total Protein 01/15/2014 7.4  6.0 - 8.3 g/dL Final  . Albumin 01/15/2014 3.9  3.5 - 5.2 g/dL Final  . Calcium 01/15/2014 9.2  8.4 - 10.5 mg/dL Final  . GFR 01/15/2014 119.33  >60.00 mL/min Final    Physical Examination:  BP 122/74  Pulse 99  Temp(Src) 97.7 F (36.5 C)  Resp 14  Ht 5\' 5"  (1.651 m)  Wt 189 lb 12.8 oz (86.093 kg)  BMI 31.58 kg/m2  SpO2 95%  ASSESSMENT:  Diabetes type 2, recently diagnosed  She has excellent blood sugars overall with upper normal A1c and no significant hyperglycemia at home Also has lost some weight She has been quite compliant with her diet and exercise regimen as well as metformin.  Mildly abnormal liver functions: These are improving, likely to be fatty liver from insulin resistance  PLAN:   She will continue her current regimen and come back in 3 months for followup A1c Reminded her to monitor some readings after meals also  Elayne Snare 01/17/2014, 3:39 PM

## 2014-04-08 ENCOUNTER — Other Ambulatory Visit: Payer: Self-pay | Admitting: Endocrinology

## 2014-04-13 ENCOUNTER — Other Ambulatory Visit (INDEPENDENT_AMBULATORY_CARE_PROVIDER_SITE_OTHER): Payer: BC Managed Care – PPO

## 2014-04-13 DIAGNOSIS — E786 Lipoprotein deficiency: Secondary | ICD-10-CM

## 2014-04-13 DIAGNOSIS — E119 Type 2 diabetes mellitus without complications: Secondary | ICD-10-CM

## 2014-04-13 LAB — URINALYSIS, ROUTINE W REFLEX MICROSCOPIC
Bilirubin Urine: NEGATIVE
Hgb urine dipstick: NEGATIVE
Ketones, ur: NEGATIVE
Leukocytes, UA: NEGATIVE
NITRITE: NEGATIVE
PH: 5.5 (ref 5.0–8.0)
SPECIFIC GRAVITY, URINE: 1.025 (ref 1.000–1.030)
Total Protein, Urine: NEGATIVE
Urine Glucose: NEGATIVE
Urobilinogen, UA: 0.2 (ref 0.0–1.0)

## 2014-04-13 LAB — COMPREHENSIVE METABOLIC PANEL
ALT: 33 U/L (ref 0–35)
AST: 26 U/L (ref 0–37)
Albumin: 4.2 g/dL (ref 3.5–5.2)
Alkaline Phosphatase: 94 U/L (ref 39–117)
BILIRUBIN TOTAL: 0.3 mg/dL (ref 0.2–1.2)
BUN: 8 mg/dL (ref 6–23)
CALCIUM: 9.5 mg/dL (ref 8.4–10.5)
CHLORIDE: 105 meq/L (ref 96–112)
CO2: 23 mEq/L (ref 19–32)
CREATININE: 0.7 mg/dL (ref 0.4–1.2)
GFR: 103.37 mL/min (ref >60.00)
Glucose, Bld: 95 mg/dL (ref 70–99)
Potassium: 4 mEq/L (ref 3.5–5.1)
Sodium: 137 mEq/L (ref 135–145)
Total Protein: 7.7 g/dL (ref 6.0–8.3)

## 2014-04-13 LAB — LIPID PANEL
CHOL/HDL RATIO: 4
Cholesterol: 172 mg/dL (ref 0–200)
HDL: 44 mg/dL (ref >39.00)
LDL Cholesterol: 90 mg/dL (ref 0–99)
NonHDL: 128
TRIGLYCERIDES: 189 mg/dL — AB (ref 0.0–149.0)
VLDL: 37.8 mg/dL (ref 0.0–40.0)

## 2014-04-13 LAB — MICROALBUMIN / CREATININE URINE RATIO
Creatinine,U: 241 mg/dL
Microalb Creat Ratio: 0.9 mg/g (ref 0.0–30.0)
Microalb, Ur: 2.1 mg/dL — ABNORMAL HIGH (ref 0.0–1.9)

## 2014-04-13 LAB — HEMOGLOBIN A1C: Hgb A1c MFr Bld: 5.9 % (ref 4.6–6.5)

## 2014-04-18 ENCOUNTER — Ambulatory Visit (INDEPENDENT_AMBULATORY_CARE_PROVIDER_SITE_OTHER): Payer: BC Managed Care – PPO | Admitting: Endocrinology

## 2014-04-18 ENCOUNTER — Encounter: Payer: Self-pay | Admitting: Endocrinology

## 2014-04-18 VITALS — BP 120/81 | HR 85 | Temp 97.7°F | Resp 14 | Ht 65.0 in | Wt 173.8 lb

## 2014-04-18 DIAGNOSIS — E119 Type 2 diabetes mellitus without complications: Secondary | ICD-10-CM

## 2014-04-18 NOTE — Patient Instructions (Signed)
Please check blood sugars at least half the time about 2 hours after any meal and 2 times per week on waking up. Please bring blood sugar monitor to each visit  

## 2014-04-18 NOTE — Progress Notes (Signed)
Patient ID: [name], female   DOB: Nov 05, 1980, 33 y.o.   MRN: [id number]   Reason for Appointment : Followup for Type 2 Diabetes  History of Present Illness          Diagnosis: Type 2 diabetes mellitus, date of diagnosis: 09/2013        Past history: She has a history of gestational diabetes with both her children, her youngest child is 61 years old She was treated with diet alone. She had seen a dietitian during her first pregnancy and was monitoring her blood sugars during both pregnancies On her annual exam with her gynecologist she was found to have diabetes with a fasting glucose was 151 and A1c 7.4. She gained about 25 pounds in 2014  Recent history:  She has excellent control of her diabetes with regimen of improved diet, increase activity and metformin She has been consistent with diet as far as reduced calories, fats and carbohydrates; trying to not eat large meals during the day She has had increased activity during summer although not always formal exercise Has been able to lose weight she had gained last year She has been taking her metformin ER, 1000 mg a day at dinner and she thinks it does help him to improve her appetite control  Her A1c is now normal She is using a FreeStyle monitor infrequently with the following results: Morning 102, lunchtime 90, supper 77  Glycemic control:  Lab Results  Component Value Date   HGBA1C 5.9 04/13/2014   HGBA1C 6.3 01/15/2014   HGBA1C 7.4* 09/18/2013   Lab Results  Component Value Date   MICROALBUR 2.1* 04/13/2014   LDLCALC 90 04/13/2014   CREATININE 0.7 04/13/2014    Self-care: The diet that the patient has been following is low fat  She is eating a [place], egg whites for breakfast; has a sandwich or salad at lunch, pasta at supper and snacks with fruit and pretzels    Meals: 3 meals per day.          Exercise:  walking 5 week, 20-30 minutes       Diabetes education was done on 11/08/13            Retinal exam: Most recent:  2014  Weight history:  Wt Readings from Last 3 Encounters:  04/18/14 173 lb 12.8 oz (78.835 kg)  01/17/14 189 lb 12.8 oz (86.093 kg)  11/16/13 195 lb 12.8 oz (88.814 kg)      Medication List       This list is accurate as of: 04/18/14  2:02 PM.  Always use your most recent med list.               doxepin 10 MG capsule  Commonly known as:  SINEQUAN  Take 10 mg by mouth.     escitalopram 10 MG tablet  Commonly known as:  LEXAPRO  Take 20 mg by mouth daily.     glucose blood test strip  Commonly known as:  FREESTYLE LITE  Use as instructed to check blood sugars once a day     metFORMIN 500 MG 24 hr tablet  Commonly known as:  GLUCOPHAGE-XR  TAKE 2 TABLETS BY MOUTH EVERY DAY WITH BREAKFAST     ONETOUCH DELICA LANCETS FINE Misc  Use to check blood sugar once a day     sertraline 100 MG tablet  Commonly known as:  ZOLOFT     SUMAtriptan 100 MG tablet  Commonly known as:  IMITREX  topiramate 100 MG tablet  Commonly known as:  TOPAMAX     zolpidem 10 MG tablet  Commonly known as:  AMBIEN  Take 10 mg by mouth at bedtime as needed for sleep.        Allergies: No Known Allergies  Past Medical History  Diagnosis Date  . Endometriosis   . Ovarian cyst   . Diabetes mellitus without complication     Past Surgical History  Procedure Laterality Date  . Pelvic laparoscopy      DIAG LAP W LASER OF ENDOMETRIOSIS  . Dilation and curettage of uterus  2005  . Cesarean section      X2  . Vaginal hysterectomy  05/10/09    LAVH LYSIS OF ADHESIONS, RIGHT SALPINECTOMY    Family History  Problem Relation Age of Onset  . Diabetes Father   . Stroke Father   . Diabetes Paternal Grandmother   . Diabetes Paternal Grandfather     Social History:  reports that she has quit smoking. She does not have any smokeless tobacco history on file. She reports that she drinks alcohol. She reports that she does not use illicit drugs.    Review of Systems       Lipids: LDL is  below 100 and HDL is improved  Lab Results  Component Value Date   CHOL 172 04/13/2014   HDL 44.00 04/13/2014   LDLCALC 90 04/13/2014   TRIG 189.0* 04/13/2014   CHOLHDL 4 04/13/2014       She has been taking Lexapro for anxiety and doxepin for insomnia  Topamax has been prescribed for migraine in the last 6 weeks   LABS:  Appointment on 04/13/2014  Component Date Value Ref Range Status  . Hemoglobin A1C 04/13/2014 5.9  4.6 - 6.5 % Final   Glycemic Control Guidelines for People with Diabetes:Non Diabetic:  <6%Goal of Therapy: <7%Additional Action Suggested:  >8%   . Sodium 04/13/2014 137  135 - 145 mEq/L Final  . Potassium 04/13/2014 4.0  3.5 - 5.1 mEq/L Final  . Chloride 04/13/2014 105  96 - 112 mEq/L Final  . CO2 04/13/2014 23  19 - 32 mEq/L Final  . Glucose, Bld 04/13/2014 95  70 - 99 mg/dL Final  . BUN 04/13/2014 8  6 - 23 mg/dL Final  . Creatinine, Ser 04/13/2014 0.7  0.4 - 1.2 mg/dL Final  . Total Bilirubin 04/13/2014 0.3  0.2 - 1.2 mg/dL Final  . Alkaline Phosphatase 04/13/2014 94  39 - 117 U/L Final  . AST 04/13/2014 26  0 - 37 U/L Final  . ALT 04/13/2014 33  0 - 35 U/L Final  . Total Protein 04/13/2014 7.7  6.0 - 8.3 g/dL Final  . Albumin 04/13/2014 4.2  3.5 - 5.2 g/dL Final  . Calcium 04/13/2014 9.5  8.4 - 10.5 mg/dL Final  . GFR 04/13/2014 103.37  >60.00 mL/min Final  . Cholesterol 04/13/2014 172  0 - 200 mg/dL Final   ATP III Classification       Desirable:  < 200 mg/dL               Borderline High:  200 - 239 mg/dL          High:  > = 240 mg/dL  . Triglycerides 04/13/2014 189.0* 0.0 - 149.0 mg/dL Final   Normal:  <150 mg/dLBorderline High:  150 - 199 mg/dL  . HDL 04/13/2014 44.00  >39.00 mg/dL Final  . VLDL 04/13/2014 37.8  0.0 - 40.0 mg/dL  Final  . LDL Cholesterol 04/13/2014 90  0 - 99 mg/dL Final  . Total CHOL/HDL Ratio 04/13/2014 4   Final                  Men          Women1/2 Average Risk     3.4          3.3Average Risk          5.0          4.42X Average  Risk          9.6          7.13X Average Risk          15.0          11.0                      . NonHDL 04/13/2014 128.00   Final  . Microalb, Ur 04/13/2014 2.1* 0.0 - 1.9 mg/dL Final  . Creatinine,U 04/13/2014 241.0   Final  . Microalb Creat Ratio 04/13/2014 0.9  0.0 - 30.0 mg/g Final  . Color, Urine 04/13/2014 YELLOW  Yellow;Lt. Yellow Final  . APPearance 04/13/2014 CLEAR  Clear Final  . Specific Gravity, Urine 04/13/2014 1.025  1.000-1.030 Final  . pH 04/13/2014 5.5  5.0 - 8.0 Final  . Total Protein, Urine 04/13/2014 NEGATIVE  Negative Final  . Urine Glucose 04/13/2014 NEGATIVE  Negative Final  . Ketones, ur 04/13/2014 NEGATIVE  Negative Final  . Bilirubin Urine 04/13/2014 NEGATIVE  Negative Final  . Hgb urine dipstick 04/13/2014 NEGATIVE  Negative Final  . Urobilinogen, UA 04/13/2014 0.2  0.0 - 1.0 Final  . Leukocytes, UA 04/13/2014 NEGATIVE  Negative Final  . Nitrite 04/13/2014 NEGATIVE  Negative Final  . WBC, UA 04/13/2014 3-6/hpf* 0-2/hpf Final  . RBC / HPF 04/13/2014 0-2/hpf  0-2/hpf Final  . Mucus, UA 04/13/2014 Presence of* None Final  . Squamous Epithelial / LPF 04/13/2014 Few(5-10/hpf)* Rare(0-4/hpf) Final  . Renal Epithel, UA 04/13/2014 Rare(0-4/hpf)* None Final  . Bacteria, UA 04/13/2014 Few(10-50/hpf)* None Final    Physical Examination:  BP 120/81  Pulse 85  Temp(Src) 97.7 F (36.5 C)  Resp 14  Ht 5\' 5"  (1.651 m)  Wt 173 lb 12.8 oz (78.835 kg)  BMI 28.92 kg/m2  SpO2 97%  ASSESSMENT:  Diabetes type 2  She has excellent blood sugars overall with upper normal A1c  Also has lost significant amount of weight, mostly what she had previously gained She has been quite compliant with her diet and exercise regimen as well as metformin 1 g daily. Rarely she will feel a little shaky but this is not accompanied by documented hypoglycemia and may be related to her delaying her meals occasionally  Mildly abnormal liver functions previously are  likely to be due to fatty  liver from insulin resistance and will need to recheck these on the next visit  PLAN:   She will continue her current regimen and come back in 6 months for followup A1c Reminded her to monitor some readings after meals also  [name] 04/18/2014, 2:02 PM

## 2014-08-06 ENCOUNTER — Encounter: Payer: Self-pay | Admitting: Endocrinology

## 2014-09-16 ENCOUNTER — Other Ambulatory Visit: Payer: Self-pay | Admitting: Endocrinology

## 2014-09-17 ENCOUNTER — Other Ambulatory Visit: Payer: Self-pay | Admitting: Endocrinology

## 2014-09-18 ENCOUNTER — Encounter: Payer: BC Managed Care – PPO | Admitting: Gynecology

## 2014-09-20 ENCOUNTER — Ambulatory Visit (INDEPENDENT_AMBULATORY_CARE_PROVIDER_SITE_OTHER): Payer: BC Managed Care – PPO | Admitting: Gynecology

## 2014-09-20 ENCOUNTER — Encounter: Payer: Self-pay | Admitting: Gynecology

## 2014-09-20 VITALS — BP 120/70 | Ht 65.0 in | Wt 175.0 lb

## 2014-09-20 DIAGNOSIS — N809 Endometriosis, unspecified: Secondary | ICD-10-CM

## 2014-09-20 DIAGNOSIS — R102 Pelvic and perineal pain unspecified side: Secondary | ICD-10-CM

## 2014-09-20 DIAGNOSIS — Z01419 Encounter for gynecological examination (general) (routine) without abnormal findings: Secondary | ICD-10-CM

## 2014-09-20 LAB — URINALYSIS W MICROSCOPIC + REFLEX CULTURE
Bilirubin Urine: NEGATIVE
CASTS: NONE SEEN
CRYSTALS: NONE SEEN
GLUCOSE, UA: NEGATIVE mg/dL
Hgb urine dipstick: NEGATIVE
Ketones, ur: NEGATIVE mg/dL
LEUKOCYTES UA: NEGATIVE
Nitrite: NEGATIVE
PH: 5.5 (ref 5.0–8.0)
Protein, ur: NEGATIVE mg/dL
Specific Gravity, Urine: 1.03 (ref 1.005–1.030)
Urobilinogen, UA: 0.2 mg/dL (ref 0.0–1.0)

## 2014-09-20 NOTE — Patient Instructions (Signed)
Follow up for ultrasound as scheduled.  You may obtain a copy of any labs that were done today by logging onto MyChart as outlined in the instructions provided with your AVS (after visit summary). The office will not call with normal lab results but certainly if there are any significant abnormalities then we will contact you.   Health Maintenance, Female A healthy lifestyle and preventative care can promote health and wellness.  Maintain regular health, dental, and eye exams.  Eat a healthy diet. Foods like vegetables, fruits, whole grains, low-fat dairy products, and lean protein foods contain the nutrients you need without too many calories. Decrease your intake of foods high in solid fats, added sugars, and salt. Get information about a proper diet from your caregiver, if necessary.  Regular physical exercise is one of the most important things you can do for your health. Most adults should get at least 150 minutes of moderate-intensity exercise (any activity that increases your heart rate and causes you to sweat) each week. In addition, most adults need muscle-strengthening exercises on 2 or more days a week.   Maintain a healthy weight. The body mass index (BMI) is a screening tool to identify possible weight problems. It provides an estimate of body fat based on height and weight. Your caregiver can help determine your BMI, and can help you achieve or maintain a healthy weight. For adults 20 years and older:  A BMI below 18.5 is considered underweight.  A BMI of 18.5 to 24.9 is normal.  A BMI of 25 to 29.9 is considered overweight.  A BMI of 30 and above is considered obese.  Maintain normal blood lipids and cholesterol by exercising and minimizing your intake of saturated fat. Eat a balanced diet with plenty of fruits and vegetables. Blood tests for lipids and cholesterol should begin at age 20 and be repeated every 5 years. If your lipid or cholesterol levels are high, you are over  50, or you are a high risk for heart disease, you may need your cholesterol levels checked more frequently.Ongoing high lipid and cholesterol levels should be treated with medicines if diet and exercise are not effective.  If you smoke, find out from your caregiver how to quit. If you do not use tobacco, do not start.  Lung cancer screening is recommended for adults aged 55 80 years who are at high risk for developing lung cancer because of a history of smoking. Yearly low-dose computed tomography (CT) is recommended for people who have at least a 30-pack-year history of smoking and are a current smoker or have quit within the past 15 years. A pack year of smoking is smoking an average of 1 pack of cigarettes a day for 1 year (for example: 1 pack a day for 30 years or 2 packs a day for 15 years). Yearly screening should continue until the smoker has stopped smoking for at least 15 years. Yearly screening should also be stopped for people who develop a health problem that would prevent them from having lung cancer treatment.  If you are pregnant, do not drink alcohol. If you are breastfeeding, be very cautious about drinking alcohol. If you are not pregnant and choose to drink alcohol, do not exceed 1 drink per day. One drink is considered to be 12 ounces (355 mL) of beer, 5 ounces (148 mL) of wine, or 1.5 ounces (44 mL) of liquor.  Avoid use of street drugs. Do not share needles with anyone. Ask for help if   if you need support or instructions about stopping the use of drugs.  High blood pressure causes heart disease and increases the risk of stroke. Blood pressure should be checked at least every 1 to 2 years. Ongoing high blood pressure should be treated with medicines, if weight loss and exercise are not effective.  If you are 55 to 34 years old, ask your caregiver if you should take aspirin to prevent strokes.  Diabetes screening involves taking a blood sample to check your fasting blood sugar level.  This should be done once every 3 years, after age 45, if you are within normal weight and without risk factors for diabetes. Testing should be considered at a younger age or be carried out more frequently if you are overweight and have at least 1 risk factor for diabetes.  Breast cancer screening is essential preventative care for women. You should practice "breast self-awareness." This means understanding the normal appearance and feel of your breasts and may include breast self-examination. Any changes detected, no matter how small, should be reported to a caregiver. Women in their 20s and 30s should have a clinical breast exam (CBE) by a caregiver as part of a regular health exam every 1 to 3 years. After age 40, women should have a CBE every year. Starting at age 40, women should consider having a mammogram (breast X-ray) every year. Women who have a family history of breast cancer should talk to their caregiver about genetic screening. Women at a high risk of breast cancer should talk to their caregiver about having an MRI and a mammogram every year.  Breast cancer gene (BRCA)-related cancer risk assessment is recommended for women who have family members with BRCA-related cancers. BRCA-related cancers include breast, ovarian, tubal, and peritoneal cancers. Having family members with these cancers may be associated with an increased risk for harmful changes (mutations) in the breast cancer genes BRCA1 and BRCA2. Results of the assessment will determine the need for genetic counseling and BRCA1 and BRCA2 testing.  The Pap test is a screening test for cervical cancer. Women should have a Pap test starting at age 21. Between ages 21 and 29, Pap tests should be repeated every 2 years. Beginning at age 30, you should have a Pap test every 3 years as long as the past 3 Pap tests have been normal. If you had a hysterectomy for a problem that was not cancer or a condition that could lead to cancer, then you no  longer need Pap tests. If you are between ages 65 and 70, and you have had normal Pap tests going back 10 years, you no longer need Pap tests. If you have had past treatment for cervical cancer or a condition that could lead to cancer, you need Pap tests and screening for cancer for at least 20 years after your treatment. If Pap tests have been discontinued, risk factors (such as a new sexual partner) need to be reassessed to determine if screening should be resumed. Some women have medical problems that increase the chance of getting cervical cancer. In these cases, your caregiver may recommend more frequent screening and Pap tests.  The human papillomavirus (HPV) test is an additional test that may be used for cervical cancer screening. The HPV test looks for the virus that can cause the cell changes on the cervix. The cells collected during the Pap test can be tested for HPV. The HPV test could be used to screen women aged 30 years and older, and   be used in women of any age who have unclear Pap test results. After the age of 63, women should have HPV testing at the same frequency as a Pap test.  Colorectal cancer can be detected and often prevented. Most routine colorectal cancer screening begins at the age of 67 and continues through age 18. However, your caregiver Emel recommend screening at an earlier age if you have risk factors for colon cancer. On a yearly basis, your caregiver Vila provide home test kits to check for hidden blood in the stool. Use of a small camera at the end of a tube, to directly examine the colon (sigmoidoscopy or colonoscopy), can detect the earliest forms of colorectal cancer. Talk to your caregiver about this at age 65, when routine screening begins. Direct examination of the colon should be repeated every 5 to 10 years through age 70, unless early forms of pre-cancerous polyps or small growths are found.  Hepatitis C blood testing is recommended for all people born from  [birth year] through 1966 and any individual with known risks for hepatitis C.  Practice safe sex. Use condoms and avoid high-risk sexual practices to reduce the spread of sexually transmitted infections (STIs). Sexually active women aged 26 and younger should be checked for Chlamydia, which is a common sexually transmitted infection. Older women with new or multiple partners should also be tested for Chlamydia. Testing for other STIs is recommended if you are sexually active and at increased risk.  Osteoporosis is a disease in which the bones lose minerals and strength with aging. This can result in serious bone fractures. The risk of osteoporosis can be identified using a bone density scan. Women ages 9 and over and women at risk for fractures or osteoporosis should discuss screening with their caregivers. Ask your caregiver whether you should be taking a calcium supplement or vitamin D to reduce the rate of osteoporosis.  Menopause can be associated with physical symptoms and risks. Hormone replacement therapy is available to decrease symptoms and risks. You should talk to your caregiver about whether hormone replacement therapy is right for you.  Use sunscreen. Apply sunscreen liberally and repeatedly throughout the day. You should seek shade when your shadow is shorter than you. Protect yourself by wearing long sleeves, pants, a wide-brimmed hat, and sunglasses year round, whenever you are outdoors.  Notify your caregiver of new moles or changes in moles, especially if there is a change in shape or color. Also notify your caregiver if a mole is larger than the size of a pencil eraser.  Stay current with your immunizations. Document Released: 04/06/2011 Document Revised: 01/16/2013 Document Reviewed: 04/06/2011 [facility] Patient Information 2014 [employer].

## 2014-09-20 NOTE — Progress Notes (Signed)
MARCOS PELOSO 04-07-1981 [id number]        33 y.o.  [id number] for annual exam.  Several issues noted below.  Past medical history,surgical history, problem list, medications, allergies, family history and social history were all reviewed and documented as reviewed in the EPIC chart.  ROS:  Performed with pertinent positives and negatives included in the history, assessment and plan.   Additional significant findings :  none   Exam: Kim Counsellor Vitals:   09/20/14 0858  BP: 120/70  Height: 5\' 5"  (1.651 m)  Weight: 175 lb (79.379 kg)   General appearance:  Normal affect, orientation and appearance. Skin: Grossly normal HEENT: Without gross lesions.  No cervical or supraclavicular adenopathy. Thyroid normal.  Lungs:  Clear without wheezing, rales or rhonchi Cardiac: RR, without RMG Abdominal:  Soft, nontender, without masses, guarding, rebound, organomegaly or hernia Breasts:  Examined lying and sitting without masses, retractions, discharge or axillary adenopathy. Pelvic:  Ext/BUS/vagina normal  Adnexa  Without masses or tenderness    Anus and perineum  Normal   Rectovaginal  Normal sphincter tone without palpated masses or tenderness.    Assessment/Plan:  33 y.o. [id number] female for annual exam.   1. Status post LAVH lysis of adhesions right salpingectomy for endometriosis 2010. Does note over the past year some ill-defined suprapubic lower pelvic discomfort that comes and goes. Throbbing aching. No other symptoms such as nausea vomiting diarrhea constipation frequency dysuria or urgency. Not consistent throughout the month area and no deep dyspareunia. Exam today is normal.  Will start with pelvic ultrasound rule out endometriomas and other nonpalpable abnormalities.  We'll also check urinalysis.  Discussed ultimate laparoscopy possibility if pain continues. 2. Pap smear 2014. No Pap smear done today. No history of significant abnormal Pap smears previously. Status post  hysterectomy for benign indications. Options to stop screening altogether or less frequent screening intervals reviewed and she is status post hysterectomy for benign indications discussed. Will readdress on an annual basis. 3. SBE monthly reviewed. Will plan screening mammography closer to 40. No strong family history of breast cancer. 4. Health maintenance. No routine blood work done as the patient reports this done through her primary physician's office. Follow up for the ultrasound otherwise annual follow up for exam.     Anastasio Auerbach MD, 9:19 AM 09/20/2014

## 2014-10-08 ENCOUNTER — Ambulatory Visit (INDEPENDENT_AMBULATORY_CARE_PROVIDER_SITE_OTHER): Payer: BC Managed Care – PPO | Admitting: Gynecology

## 2014-10-08 ENCOUNTER — Ambulatory Visit (INDEPENDENT_AMBULATORY_CARE_PROVIDER_SITE_OTHER): Payer: BC Managed Care – PPO

## 2014-10-08 ENCOUNTER — Encounter: Payer: Self-pay | Admitting: Gynecology

## 2014-10-08 DIAGNOSIS — R102 Pelvic and perineal pain: Secondary | ICD-10-CM

## 2014-10-08 DIAGNOSIS — N809 Endometriosis, unspecified: Secondary | ICD-10-CM

## 2014-10-08 NOTE — Progress Notes (Signed)
[name] Oct 10, 1980 [id number]        33 y.o.  [id number] presents for ultrasound. Patient has history of LAVH lysis of adhesions right salpingectomy for endometriosis 2010. Does note over the past year some ill-defined suprapubic lower pelvic discomfort that comes and goes. Throbbing aching. No other symptoms such as nausea vomiting diarrhea constipation frequency dysuria or urgency. Not consistent throughout the month and no deep dyspareunia. Exam was normal. Urinalysis was negative.  Past medical history,surgical history, problem list, medications, allergies, family history and social history were all reviewed and documented in the EPIC chart.  Directed ROS with pertinent positives and negatives documented in the history of present illness/assessment and plan.  Ultrasound shows both ovaries visualized with physiologic changes. No overt endometriomas or other pathology. Cul-de-sac is negative. Cuff negative.  Assessment/Plan:  34 y.o. [id number] with above history. Ultrasounds negative. Exam is normal. Reviewed with patient possibilities to include endometriosis or small endometriomas. Options to proceed with laparoscopy versus observation at present discussed. No other localizing symptoms to other organ systems such as frequency dysuria urgency to suggest interstitial cystitis. No nausea vomiting diarrhea constipation bloating to suggest bowel dysfunction. At this point patient prefers just to observe. Will follow up if her symptoms persist or worsen and she wants to reconsider laparoscopy. Otherwise will follow up in one year when she is due for her annual exam.     Anastasio Auerbach MD, 12:56 PM 10/08/2014

## 2014-10-08 NOTE — Patient Instructions (Signed)
Follow up in one year for annual exam, sooner if symptoms worsen or change.

## 2014-10-16 ENCOUNTER — Other Ambulatory Visit: Payer: BC Managed Care – PPO

## 2014-10-16 ENCOUNTER — Other Ambulatory Visit (INDEPENDENT_AMBULATORY_CARE_PROVIDER_SITE_OTHER): Payer: BLUE CROSS/BLUE SHIELD

## 2014-10-16 DIAGNOSIS — E119 Type 2 diabetes mellitus without complications: Secondary | ICD-10-CM

## 2014-10-16 LAB — COMPREHENSIVE METABOLIC PANEL
ALBUMIN: 4.1 g/dL (ref 3.5–5.2)
ALT: 23 U/L (ref 0–35)
AST: 19 U/L (ref 0–37)
Alkaline Phosphatase: 85 U/L (ref 39–117)
BUN: 8 mg/dL (ref 6–23)
CALCIUM: 9.1 mg/dL (ref 8.4–10.5)
CHLORIDE: 110 meq/L (ref 96–112)
CO2: 21 mEq/L (ref 19–32)
Creatinine, Ser: 0.6 mg/dL (ref 0.4–1.2)
GFR: 114.46 mL/min (ref >60.00)
GLUCOSE: 95 mg/dL (ref 70–99)
Potassium: 4.2 mEq/L (ref 3.5–5.1)
SODIUM: 138 meq/L (ref 135–145)
Total Bilirubin: 0.3 mg/dL (ref 0.2–1.2)
Total Protein: 7.3 g/dL (ref 6.0–8.3)

## 2014-10-16 LAB — HEMOGLOBIN A1C: HEMOGLOBIN A1C: 5.7 % (ref 4.6–6.5)

## 2014-10-19 ENCOUNTER — Encounter: Payer: Self-pay | Admitting: Endocrinology

## 2014-10-19 ENCOUNTER — Ambulatory Visit (INDEPENDENT_AMBULATORY_CARE_PROVIDER_SITE_OTHER): Payer: BLUE CROSS/BLUE SHIELD | Admitting: Endocrinology

## 2014-10-19 ENCOUNTER — Ambulatory Visit: Payer: BC Managed Care – PPO | Admitting: Endocrinology

## 2014-10-19 VITALS — BP 119/70 | HR 82 | Temp 98.2°F | Resp 14 | Ht 65.0 in | Wt 175.6 lb

## 2014-10-19 DIAGNOSIS — E119 Type 2 diabetes mellitus without complications: Secondary | ICD-10-CM | POA: Diagnosis not present

## 2014-10-19 NOTE — Progress Notes (Signed)
Patient ID: [name], female   DOB: 03/21/1981, 34 y.o.   MRN: [id number]   Reason for Appointment : Followup for Type 2 Diabetes  History of Present Illness          Diagnosis: Type 2 diabetes mellitus, date of diagnosis: 09/2013        Past history: She has a history of gestational diabetes with both her children, her youngest child is 53 years old She was treated with diet alone. She had seen a dietitian during her first pregnancy and was monitoring her blood sugars during both pregnancies On her annual exam with her gynecologist she was found to have diabetes with a fasting glucose was 151 and A1c 7.4. She gained about 25 pounds in 2014  Recent history:  She continues to have excellent control of her diabetes with regimen of fairly good diet, usually regular exercise and metformin She has been consistent with diet and trying to not eat large meals during the day Has maintained her weight loss. She has had less activity with walking in wintertime She has been taking her metformin ER, 1000 mg a day at dinner and she thinks it does help him to improve her appetite control  Her A1c is again quite normal and slightly better However she is checking her blood sugar very regularly and has only one glucose of 88 in the afternoon  Glycemic control:   Lab Results  Component Value Date   HGBA1C 5.7 10/16/2014   HGBA1C 5.9 04/13/2014   HGBA1C 6.3 01/15/2014   Lab Results  Component Value Date   MICROALBUR 2.1* 04/13/2014   LDLCALC 90 04/13/2014   CREATININE 0.6 10/16/2014    Self-care: The diet that the patient has been following is low fat  She is eating a [place], egg whites for breakfast; has a sandwich or salad at lunch, pasta at supper and snacks with fruit and pretzels    Meals: 3 meals per day.          Exercise:  walking 2 days a week, 20-30 minutes       Diabetes education was done on 11/08/13            Retinal exam: Most recent: 2014  Weight history:  Wt Readings from  Last 3 Encounters:  10/19/14 175 lb 9.6 oz (79.652 kg)  09/20/14 175 lb (79.379 kg)  04/18/14 173 lb 12.8 oz (78.835 kg)      Medication List       This list is accurate as of: 10/19/14  9:06 AM.  Always use your most recent med list.               glucose blood test strip  Commonly known as:  FREESTYLE LITE  Use as instructed to check blood sugars once a day     metFORMIN 500 MG 24 hr tablet  Commonly known as:  GLUCOPHAGE-XR  TAKE 2 TABLETS BY MOUTH EVERY DAY WITH BREAKFAST     ONETOUCH DELICA LANCETS FINE Misc  Use to check blood sugar once a day     sertraline 100 MG tablet  Commonly known as:  ZOLOFT     SUMAtriptan 100 MG tablet  Commonly known as:  IMITREX     topiramate 100 MG tablet  Commonly known as:  TOPAMAX     zolpidem 10 MG tablet  Commonly known as:  AMBIEN  Take 10 mg by mouth at bedtime as needed for sleep.  Allergies: No Known Allergies  Past Medical History  Diagnosis Date  . Diabetes mellitus without complication     Past Surgical History  Procedure Laterality Date  . Pelvic laparoscopy      DIAG LAP W LASER OF ENDOMETRIOSIS  . Dilation and curettage of uterus  2005  . Cesarean section      X2  . Vaginal hysterectomy  05/10/09    LAVH LYSIS OF ADHESIONS, RIGHT SALPINECTOMY.  Endometriosis    Family History  Problem Relation Age of Onset  . Diabetes Father   . Stroke Father   . Diabetes Paternal Grandmother   . Diabetes Paternal Grandfather     Social History:  reports that she has quit smoking. She does not have any smokeless tobacco history on file. She reports that she does not drink alcohol or use illicit drugs.    Review of Systems       Lipids: LDL is below 100 and HDL is above 40, not on medications  Lab Results  Component Value Date   CHOL 172 04/13/2014   HDL 44.00 04/13/2014   LDLCALC 90 04/13/2014   TRIG 189.0* 04/13/2014   CHOLHDL 4 04/13/2014       She has been taking Lexapro for anxiety and  doxepin for insomnia  Topamax has been prescribed for migraine   LABS:  Appointment on 10/16/2014  Component Date Value Ref Range Status  . Hgb A1c MFr Bld 10/16/2014 5.7  4.6 - 6.5 % Final   Glycemic Control Guidelines for People with Diabetes:Non Diabetic:  <6%Goal of Therapy: <7%Additional Action Suggested:  >8%   . Sodium 10/16/2014 138  135 - 145 mEq/L Final  . Potassium 10/16/2014 4.2  3.5 - 5.1 mEq/L Final  . Chloride 10/16/2014 110  96 - 112 mEq/L Final  . CO2 10/16/2014 21  19 - 32 mEq/L Final  . Glucose, Bld 10/16/2014 95  70 - 99 mg/dL Final  . BUN 10/16/2014 8  6 - 23 mg/dL Final  . Creatinine, Ser 10/16/2014 0.6  0.4 - 1.2 mg/dL Final  . Total Bilirubin 10/16/2014 0.3  0.2 - 1.2 mg/dL Final  . Alkaline Phosphatase 10/16/2014 85  39 - 117 U/L Final  . AST 10/16/2014 19  0 - 37 U/L Final  . ALT 10/16/2014 23  0 - 35 U/L Final  . Total Protein 10/16/2014 7.3  6.0 - 8.3 g/dL Final  . Albumin 10/16/2014 4.1  3.5 - 5.2 g/dL Final  . Calcium 10/16/2014 9.1  8.4 - 10.5 mg/dL Final  . GFR 10/16/2014 114.46  >60.00 mL/min Final    Physical Examination:  BP 119/70 mmHg  Pulse 82  Temp(Src) 98.2 F (36.8 C)  Resp 14  Ht 5\' 5"  (1.651 m)  Wt 175 lb 9.6 oz (79.652 kg)  BMI 29.22 kg/m2  SpO2 97%  ASSESSMENT:  Diabetes type 2  She has excellent blood sugars overall with upper normal A1c  Also has been able to keep off her weight loss with excellent diet usually However she has not been exercising this winter and has not been motivated to do so She is doing well with metformin alone  Liver function abnormality: Resolved Mildly abnormal liver functions previously are  likely to be due to fatty liver from insulin resistance and will need to recheck these on the next visit  PLAN:  Start regular exercise, she will use home video programs She will continue her current regimen and come back in 6 months for followup A1c  Reminded her to monitor blood glucose readings after  meals regularly  Yonathan Perrow 10/19/2014, 9:06 AM

## 2014-12-22 ENCOUNTER — Other Ambulatory Visit: Payer: Self-pay | Admitting: Endocrinology

## 2015-04-01 ENCOUNTER — Other Ambulatory Visit: Payer: Self-pay

## 2015-04-10 ENCOUNTER — Encounter: Payer: Self-pay | Admitting: Endocrinology

## 2015-04-11 ENCOUNTER — Other Ambulatory Visit: Payer: Self-pay | Admitting: Endocrinology

## 2015-04-11 DIAGNOSIS — L659 Nonscarring hair loss, unspecified: Secondary | ICD-10-CM

## 2015-04-16 ENCOUNTER — Other Ambulatory Visit (INDEPENDENT_AMBULATORY_CARE_PROVIDER_SITE_OTHER): Payer: BLUE CROSS/BLUE SHIELD

## 2015-04-16 DIAGNOSIS — E119 Type 2 diabetes mellitus without complications: Secondary | ICD-10-CM

## 2015-04-16 DIAGNOSIS — L659 Nonscarring hair loss, unspecified: Secondary | ICD-10-CM | POA: Diagnosis not present

## 2015-04-16 LAB — COMPREHENSIVE METABOLIC PANEL
ALT: 19 U/L (ref 0–35)
AST: 18 U/L (ref 0–37)
Albumin: 4.4 g/dL (ref 3.5–5.2)
Alkaline Phosphatase: 87 U/L (ref 39–117)
BUN: 12 mg/dL (ref 6–23)
CO2: 24 mEq/L (ref 19–32)
Calcium: 9.7 mg/dL (ref 8.4–10.5)
Chloride: 107 mEq/L (ref 96–112)
Creatinine, Ser: 0.64 mg/dL (ref 0.40–1.20)
GFR: 112.08 mL/min (ref >60.00)
Glucose, Bld: 90 mg/dL (ref 70–99)
POTASSIUM: 4.3 meq/L (ref 3.5–5.1)
SODIUM: 138 meq/L (ref 135–145)
Total Bilirubin: 0.4 mg/dL (ref 0.2–1.2)
Total Protein: 7.7 g/dL (ref 6.0–8.3)

## 2015-04-16 LAB — HEMOGLOBIN A1C: Hgb A1c MFr Bld: 5.2 % (ref 4.6–6.5)

## 2015-04-16 LAB — T4, FREE: Free T4: 0.86 ng/dL (ref 0.60–1.60)

## 2015-04-16 LAB — TSH: TSH: 2.21 u[IU]/mL (ref 0.35–4.50)

## 2015-04-19 ENCOUNTER — Ambulatory Visit (INDEPENDENT_AMBULATORY_CARE_PROVIDER_SITE_OTHER): Payer: BLUE CROSS/BLUE SHIELD | Admitting: Endocrinology

## 2015-04-19 ENCOUNTER — Encounter: Payer: Self-pay | Admitting: Endocrinology

## 2015-04-19 VITALS — BP 116/72 | HR 80 | Temp 97.6°F | Resp 16 | Ht 65.0 in | Wt 170.8 lb

## 2015-04-19 DIAGNOSIS — E119 Type 2 diabetes mellitus without complications: Secondary | ICD-10-CM

## 2015-04-19 DIAGNOSIS — E786 Lipoprotein deficiency: Secondary | ICD-10-CM

## 2015-04-19 NOTE — Progress Notes (Signed)
Patient ID: [name], female   DOB: 1980-10-23, 34 y.o.   MRN: [id number]   Reason for Appointment : Followup for Type 2 Diabetes  History of Present Illness          Diagnosis: Type 2 diabetes mellitus, date of diagnosis: 09/2013        Past history: She has a history of gestational diabetes with both her children, her youngest child is 59 years old She was treated with diet alone. She had seen a dietitian during her first pregnancy and was monitoring her blood sugars during both pregnancies On her annual exam with her gynecologist she was found to have diabetes with a fasting glucose was 151 and A1c 7.4. She gained about 25 pounds in 2014  Recent history:  She continues to have excellent control of her diabetes with regimen of fairly good diet, usually regular exercise and metformin She has been consistent with diet and trying to not eat large meals during the day Has maintained her weight loss. Now trying to be active and walking regularly She has been taking her metformin ER, 1000 mg a day at dinner  She thinks it does help her to improve her appetite control  Her A1c is again quite normal and slightly better at 5.2 However she is checking her blood sugar very sporadically, recent range 85-95 but no readings after supper  Glycemic control:   Lab Results  Component Value Date   HGBA1C 5.2 04/16/2015   HGBA1C 5.7 10/16/2014   HGBA1C 5.9 04/13/2014   Lab Results  Component Value Date   MICROALBUR 2.1* 04/13/2014   LDLCALC 90 04/13/2014   CREATININE 0.64 04/16/2015    Self-care: The diet that the patient has been following is low fat  She is eating a [place], egg whites for breakfast; has a sandwich or salad at lunch, pasta at supper and snacks with fruit and pretzels   Exercise:  walking fairly regularly       Diabetes education was done on 11/08/13            Retinal exam: Most recent: 2014  Weight history:  Wt Readings from Last 3 Encounters:  04/19/15 170 lb 12.8  oz (77.474 kg)  10/19/14 175 lb 9.6 oz (79.652 kg)  09/20/14 175 lb (79.379 kg)      Medication List       This list is accurate as of: 04/19/15  9:39 AM.  Always use your most recent med list.               clonazePAM 0.5 MG tablet  Commonly known as:  KLONOPIN  Take 0.5 mg by mouth 2 (two) times daily.     glucose blood test strip  Commonly known as:  FREESTYLE LITE  Use as instructed to check blood sugars once a day     metFORMIN 500 MG 24 hr tablet  Commonly known as:  GLUCOPHAGE-XR  TAKE 2 TABLETS BY MOUTH EVERY DAY WITH BREAKFAST     ONETOUCH DELICA LANCETS FINE Misc  Use to check blood sugar once a day     sertraline 100 MG tablet  Commonly known as:  ZOLOFT     SUMAtriptan 100 MG tablet  Commonly known as:  IMITREX     topiramate 100 MG tablet  Commonly known as:  TOPAMAX     zolpidem 10 MG tablet  Commonly known as:  AMBIEN  Take 10 mg by mouth at bedtime as needed for sleep.  Allergies: No Known Allergies  Past Medical History  Diagnosis Date  . Diabetes mellitus without complication     Past Surgical History  Procedure Laterality Date  . Pelvic laparoscopy      DIAG LAP W LASER OF ENDOMETRIOSIS  . Dilation and curettage of uterus  2005  . Cesarean section      X2  . Vaginal hysterectomy  05/10/09    LAVH LYSIS OF ADHESIONS, RIGHT SALPINECTOMY.  Endometriosis    Family History  Problem Relation Age of Onset  . Diabetes Father   . Stroke Father   . Hypertension Father   . Diabetes Paternal Grandmother   . Diabetes Paternal Grandfather   . Heart disease Paternal Grandfather   . Thyroid disease Neg Hx   . Heart disease Maternal Grandfather     Social History:  reports that she has quit smoking. She does not have any smokeless tobacco history on file. She reports that she does not drink alcohol or use illicit drugs.    Review of Systems       Lipids: LDL is below 100 and HDL is above 40, not on medications  Lab Results   Component Value Date   CHOL 172 04/13/2014   HDL 44.00 04/13/2014   LDLCALC 90 04/13/2014   TRIG 189.0* 04/13/2014   CHOLHDL 4 04/13/2014       She has been taking Zoloft for anxiety and doxepin for insomnia  Topamax has been prescribed for migraine  She was concerned about recent hair loss and wanted thyroid checked.  This is normal   LABS:  Lab on 04/16/2015  Component Date Value Ref Range Status  . Hgb A1c MFr Bld 04/16/2015 5.2  4.6 - 6.5 % Final   Glycemic Control Guidelines for People with Diabetes:Non Diabetic:  <6%Goal of Therapy: <7%Additional Action Suggested:  >8%   . Sodium 04/16/2015 138  135 - 145 mEq/L Final  . Potassium 04/16/2015 4.3  3.5 - 5.1 mEq/L Final  . Chloride 04/16/2015 107  96 - 112 mEq/L Final  . CO2 04/16/2015 24  19 - 32 mEq/L Final  . Glucose, Bld 04/16/2015 90  70 - 99 mg/dL Final  . BUN 04/16/2015 12  6 - 23 mg/dL Final  . Creatinine, Ser 04/16/2015 0.64  0.40 - 1.20 mg/dL Final  . Total Bilirubin 04/16/2015 0.4  0.2 - 1.2 mg/dL Final  . Alkaline Phosphatase 04/16/2015 87  39 - 117 U/L Final  . AST 04/16/2015 18  0 - 37 U/L Final  . ALT 04/16/2015 19  0 - 35 U/L Final  . Total Protein 04/16/2015 7.7  6.0 - 8.3 g/dL Final  . Albumin 04/16/2015 4.4  3.5 - 5.2 g/dL Final  . Calcium 04/16/2015 9.7  8.4 - 10.5 mg/dL Final  . GFR 04/16/2015 112.08  >60.00 mL/min Final  . TSH 04/16/2015 2.21  0.35 - 4.50 uIU/mL Final  . Free T4 04/16/2015 0.86  0.60 - 1.60 ng/dL Final    Physical Examination:  BP 116/72 mmHg  Pulse 80  Temp(Src) 97.6 F (36.4 C)  Resp 16  Ht 5\' 5"  (1.651 m)  Wt 170 lb 12.8 oz (77.474 kg)  BMI 28.42 kg/m2  SpO2 97%  ASSESSMENT:  Diabetes type 2  She has excellent blood sugars overall with upper normal A1c  Also has been able to keep off her weight loss with excellent diet and regular walking She is doing well with metformin alone.  Since her A1c is only 5.2 she  probably does not need to be on metformin but she  previously had felt that it helped her watch her diet better and will continue  Hair loss: She will discuss with dermatologist  PLAN:  Continue regular exercise and prudent diet She will continue her metformin for now and come back in 6 months for followup A1c Check lipids on the next visit  [name] 04/19/2015, 9:39 AM

## 2015-06-18 ENCOUNTER — Other Ambulatory Visit: Payer: Self-pay | Admitting: Endocrinology

## 2015-09-23 ENCOUNTER — Ambulatory Visit (INDEPENDENT_AMBULATORY_CARE_PROVIDER_SITE_OTHER): Payer: BLUE CROSS/BLUE SHIELD | Admitting: Gynecology

## 2015-09-23 ENCOUNTER — Encounter: Payer: Self-pay | Admitting: Gynecology

## 2015-09-23 VITALS — BP 116/76 | Ht 64.0 in | Wt 178.0 lb

## 2015-09-23 DIAGNOSIS — Z01419 Encounter for gynecological examination (general) (routine) without abnormal findings: Secondary | ICD-10-CM

## 2015-09-23 NOTE — Patient Instructions (Signed)

## 2015-09-23 NOTE — Progress Notes (Signed)
[name] [birth year] [id number]        34 y.o.  [id number]  for annual exam.  Doing well without complaints.  Past medical history,surgical history, problem list, medications, allergies, family history and social history were all reviewed and documented as reviewed in the EPIC chart.  ROS:  Performed with pertinent positives and negatives included in the history, assessment and plan.   Additional significant findings :  none   Exam: Kim Counsellor Vitals:   09/23/15 0920  BP: 116/76  Height: 5\' 4"  (1.626 m)  Weight: 178 lb (80.74 kg)   General appearance:  Normal affect, orientation and appearance. Skin: Grossly normal HEENT: Without gross lesions.  No cervical or supraclavicular adenopathy. Thyroid normal.  Lungs:  Clear without wheezing, rales or rhonchi Cardiac: RR, without RMG Abdominal:  Soft, nontender, without masses, guarding, rebound, organomegaly or hernia Breasts:  Examined lying and sitting without masses, retractions, discharge or axillary adenopathy. Pelvic:  Ext/BUS/vagina normal  Adnexa  Without masses or tenderness    Anus and perineum  Normal   Rectovaginal  Normal sphincter tone without palpated masses or tenderness.    Assessment/Plan:  34 y.o. [id number] female for annual exam.   1. Status post LAVH, lysis of adhesions, right salpingectomy for endometriosis 2010. Doing well without complaints.  History of pelvic discomfort last year but this seems to have resolved. Continue monitoring report any issues. 2. Pap smear 2014. No Pap smear done today. No history of significant abnormal Pap smears. Options to stop screening as patient is status post hysterectomy for benign indications per current screening guidelines versus less frequent screening intervals reviewed. Will readdress on an annual basis. 3. Mammographic screening recommendations between 35 and 40 reviewed. No strong family history. Patient is going to wait closer to 40. SBE monthly  reviewed. 4. Health maintenance. No routine lab work done as this is done at her primary physician's office. Follow up 1 year, sooner as needed.   Anastasio Auerbach MD, 9:40 AM 09/23/2015

## 2015-10-16 ENCOUNTER — Other Ambulatory Visit: Payer: BLUE CROSS/BLUE SHIELD

## 2015-10-16 ENCOUNTER — Other Ambulatory Visit (INDEPENDENT_AMBULATORY_CARE_PROVIDER_SITE_OTHER): Payer: BLUE CROSS/BLUE SHIELD

## 2015-10-16 DIAGNOSIS — E119 Type 2 diabetes mellitus without complications: Secondary | ICD-10-CM

## 2015-10-16 LAB — COMPREHENSIVE METABOLIC PANEL
ALBUMIN: 4.5 g/dL (ref 3.5–5.2)
ALT: 25 U/L (ref 0–35)
AST: 22 U/L (ref 0–37)
Alkaline Phosphatase: 96 U/L (ref 39–117)
BUN: 7 mg/dL (ref 6–23)
CO2: 24 mEq/L (ref 19–32)
Calcium: 9.4 mg/dL (ref 8.4–10.5)
Chloride: 106 mEq/L (ref 96–112)
Creatinine, Ser: 0.65 mg/dL (ref 0.40–1.20)
GFR: 109.78 mL/min (ref >60.00)
Glucose, Bld: 95 mg/dL (ref 70–99)
Potassium: 3.5 mEq/L (ref 3.5–5.1)
Sodium: 139 mEq/L (ref 135–145)
TOTAL PROTEIN: 7.4 g/dL (ref 6.0–8.3)
Total Bilirubin: 0.5 mg/dL (ref 0.2–1.2)

## 2015-10-16 LAB — HEMOGLOBIN A1C: Hgb A1c MFr Bld: 5.7 % (ref 4.6–6.5)

## 2015-10-21 ENCOUNTER — Ambulatory Visit (INDEPENDENT_AMBULATORY_CARE_PROVIDER_SITE_OTHER): Payer: BLUE CROSS/BLUE SHIELD | Admitting: Endocrinology

## 2015-10-21 ENCOUNTER — Encounter: Payer: Self-pay | Admitting: Endocrinology

## 2015-10-21 VITALS — BP 118/74 | HR 75 | Temp 97.7°F | Resp 14 | Ht 64.0 in | Wt 177.6 lb

## 2015-10-21 DIAGNOSIS — E119 Type 2 diabetes mellitus without complications: Secondary | ICD-10-CM | POA: Diagnosis not present

## 2015-10-21 NOTE — Addendum Note (Signed)
Addended by: Elayne Snare on: 10/21/2015 08:26 AM   Modules accepted: Orders

## 2015-10-21 NOTE — Progress Notes (Signed)
Patient ID: [name], female   DOB: [birth year], 35 y.o.   MRN: [id number]   Reason for Appointment : Followup for Type 2 Diabetes  History of Present Illness          Diagnosis: Type 2 diabetes mellitus, date of diagnosis: 09/2013        Past history: She has a history of gestational diabetes with both her children, her youngest child is 11 years old She was treated with diet alone. She had seen a dietitian during her first pregnancy and was monitoring her blood sugars during both pregnancies On her annual exam with her gynecologist she was found to have diabetes with a fasting glucose was 151 and A1c 7.4. She gained about 25 pounds in 2014  Recent history:  She continues to have good control of her mild diabetes Currently on low-dose metformin only  Current management, blood sugar patterns, problems identified:  Although she has a normal A1c appears to be relatively higher than the last time  Has not checked any blood sugars at home except a couple in the last 3 months and currently using expired test strips  Lab glucose was normal.  Her weight has gone up and she does not seem to be exercising, not as motivated  Also recently not consistent with diet and less motivated  She has been taking her metformin ER, 1000 mg a day at dinner  She thinks it does help her to improve her appetite control  Current monitor: Freestyle  Glycemic control:   Lab Results  Component Value Date   HGBA1C 5.7 10/16/2015   HGBA1C 5.2 04/16/2015   HGBA1C 5.7 10/16/2014   Lab Results  Component Value Date   MICROALBUR 2.1* 04/13/2014   LDLCALC 90 04/13/2014   CREATININE 0.65 10/16/2015    Self-care: The diet that the patient has been following is low fat  She is eating a [place], egg whites for breakfast; has a sandwich or salad at lunch, pasta at supper and snacks with fruit and pretzels   Exercise:  walking irregularly        Diabetes education was done on 11/08/13              Retinal exam: Most recent: 2014  Weight history:  Wt Readings from Last 3 Encounters:  10/21/15 177 lb 9.6 oz (80.559 kg)  09/23/15 178 lb (80.74 kg)  04/19/15 170 lb 12.8 oz (77.474 kg)      Medication List       This list is accurate as of: 10/21/15  8:20 AM.  Always use your most recent med list.               clonazePAM 0.5 MG tablet  Commonly known as:  KLONOPIN  Take 0.5 mg by mouth 2 (two) times daily.     glucose blood test strip  Commonly known as:  FREESTYLE LITE  Use as instructed to check blood sugars once a day     metFORMIN 500 MG 24 hr tablet  Commonly known as:  GLUCOPHAGE-XR  TAKE 2 TABLETS BY MOUTH EVERY DAY WITH BREAKFAST     ONETOUCH DELICA LANCETS FINE Misc  Use to check blood sugar once a day     sertraline 100 MG tablet  Commonly known as:  ZOLOFT     SUMAtriptan 100 MG tablet  Commonly known as:  IMITREX     topiramate 100 MG tablet  Commonly known as:  TOPAMAX  zolpidem 10 MG tablet  Commonly known as:  AMBIEN  Take 10 mg by mouth at bedtime as needed for sleep.        Allergies: No Known Allergies  Past Medical History  Diagnosis Date  . Diabetes mellitus without complication [facility])     Past Surgical History  Procedure Laterality Date  . Pelvic laparoscopy      DIAG LAP W LASER OF ENDOMETRIOSIS  . Dilation and curettage of uterus  2005  . Cesarean section      X2  . Vaginal hysterectomy  05/10/09    LAVH LYSIS OF ADHESIONS, RIGHT SALPINECTOMY.  Endometriosis    Family History  Problem Relation Age of Onset  . Diabetes Father   . Stroke Father   . Hypertension Father   . Diabetes Paternal Grandmother   . Diabetes Paternal Grandfather   . Heart disease Paternal Grandfather   . Thyroid disease Neg Hx   . Heart disease Maternal Grandfather     Social History:  reports that she has quit smoking. She does not have any smokeless tobacco history on file. She reports that she drinks alcohol. She reports that she does  not use illicit drugs.    Review of Systems       Lipids: LDL is below 100 and HDL is above 40, needs follow-up  Lab Results  Component Value Date   CHOL 172 04/13/2014   HDL 44.00 04/13/2014   LDLCALC 90 04/13/2014   TRIG 189.0* 04/13/2014   CHOLHDL 4 04/13/2014       She has been taking Zoloft for anxiety and doxepin for insomnia  Topamax has been prescribed for migraine, no change in recent doses  Liver functions have been normal Potassium is low normal    LABS:  Lab on 10/16/2015  Component Date Value Ref Range Status  . Hgb A1c MFr Bld 10/16/2015 5.7  4.6 - 6.5 % Final   Glycemic Control Guidelines for People with Diabetes:Non Diabetic:  <6%Goal of Therapy: <7%Additional Action Suggested:  >8%   . Sodium 10/16/2015 139  135 - 145 mEq/L Final  . Potassium 10/16/2015 3.5  3.5 - 5.1 mEq/L Final  . Chloride 10/16/2015 106  96 - 112 mEq/L Final  . CO2 10/16/2015 24  19 - 32 mEq/L Final  . Glucose, Bld 10/16/2015 95  70 - 99 mg/dL Final  . BUN 10/16/2015 7  6 - 23 mg/dL Final  . Creatinine, Ser 10/16/2015 0.65  0.40 - 1.20 mg/dL Final  . Total Bilirubin 10/16/2015 0.5  0.2 - 1.2 mg/dL Final  . Alkaline Phosphatase 10/16/2015 96  39 - 117 U/L Final  . AST 10/16/2015 22  0 - 37 U/L Final  . ALT 10/16/2015 25  0 - 35 U/L Final  . Total Protein 10/16/2015 7.4  6.0 - 8.3 g/dL Final  . Albumin 10/16/2015 4.5  3.5 - 5.2 g/dL Final  . Calcium 10/16/2015 9.4  8.4 - 10.5 mg/dL Final  . GFR 10/16/2015 109.78  >60.00 mL/min Final    Physical Examination:  BP 118/74 mmHg  Pulse 75  Temp(Src) 97.7 F (36.5 C)  Resp 14  Ht 5\' 4"  (1.626 m)  Wt 177 lb 9.6 oz (80.559 kg)  BMI 30.47 kg/m2  SpO2 98%  ASSESSMENT:  Diabetes type 2  She has had fairly good blood sugars with A1c 5.7 However is starting to gain back some weight which she had been able to keep down with exercise, diet and continuing metformin previously  Discussed need for improving her diet and exercise regimen  and being consistent on daily management Discussed periodic monitoring of glucose to help her with compliance  History of low HDL: Needs follow-up levels     PLAN:   Resume  regular exercise and prudent diet Check blood sugars periodically, use new test strips  She will continue her metformin for now and come back in 4 months for followup A1c Check lipids on the next visit  Su Duma 10/21/2015, 8:20 AM

## 2015-10-21 NOTE — Patient Instructions (Addendum)
Start exercise program  Get new strips  Check blood sugars on waking up 3  times a week Also check blood sugars about 2 hours after a meal and do this after different meals by rotation  Recommended blood sugar levels on waking up is 90-130 and about 2 hours after meal is 130-160  Please bring your blood sugar monitor to each visit, thank you

## 2015-11-08 ENCOUNTER — Other Ambulatory Visit: Payer: Self-pay | Admitting: Endocrinology

## 2016-02-12 ENCOUNTER — Other Ambulatory Visit: Payer: BLUE CROSS/BLUE SHIELD

## 2016-02-13 ENCOUNTER — Ambulatory Visit: Payer: BLUE CROSS/BLUE SHIELD | Admitting: Endocrinology

## 2016-02-19 ENCOUNTER — Other Ambulatory Visit: Payer: Self-pay | Admitting: *Deleted

## 2016-02-19 MED ORDER — METFORMIN HCL ER 500 MG PO TB24: ORAL_TABLET | ORAL | Status: DC

## 2016-04-12 ENCOUNTER — Other Ambulatory Visit: Payer: Self-pay | Admitting: Endocrinology

## 2016-06-05 DIAGNOSIS — G4700 Insomnia, unspecified: Secondary | ICD-10-CM | POA: Diagnosis not present

## 2016-06-05 DIAGNOSIS — G43909 Migraine, unspecified, not intractable, without status migrainosus: Secondary | ICD-10-CM | POA: Diagnosis not present

## 2016-06-05 DIAGNOSIS — F419 Anxiety disorder, unspecified: Secondary | ICD-10-CM | POA: Diagnosis not present

## 2016-06-05 DIAGNOSIS — E119 Type 2 diabetes mellitus without complications: Secondary | ICD-10-CM | POA: Diagnosis not present

## 2016-09-23 ENCOUNTER — Ambulatory Visit (INDEPENDENT_AMBULATORY_CARE_PROVIDER_SITE_OTHER): Payer: BLUE CROSS/BLUE SHIELD | Admitting: Gynecology

## 2016-09-23 ENCOUNTER — Encounter: Payer: Self-pay | Admitting: Gynecology

## 2016-09-23 VITALS — BP 120/76 | Ht 64.5 in | Wt 189.0 lb

## 2016-09-23 DIAGNOSIS — Z01419 Encounter for gynecological examination (general) (routine) without abnormal findings: Secondary | ICD-10-CM | POA: Diagnosis not present

## 2016-09-23 NOTE — Patient Instructions (Signed)

## 2016-09-23 NOTE — Progress Notes (Signed)
    ANNIECE DEPERALTA 09/28/1981 [id number]        35 y.o.  [id number] for annual exam.    Past medical history,surgical history, problem list, medications, allergies, family history and social history were all reviewed and documented as reviewed in the EPIC chart.  ROS:  Performed with pertinent positives and negatives included in the history, assessment and plan.   Additional significant findings :  None   Exam: Caryn Bee assistant Vitals:   09/23/16 0917  BP: 120/76  Weight: 189 lb (85.7 kg)  Height: 5' 4.5" (1.638 m)   Body mass index is 31.94 kg/m.  General appearance:  Normal affect, orientation and appearance. Skin: Grossly normal HEENT: Without gross lesions.  No cervical or supraclavicular adenopathy. Thyroid normal.  Lungs:  Clear without wheezing, rales or rhonchi Cardiac: RR, without RMG Abdominal:  Soft, nontender, without masses, guarding, rebound, organomegaly or hernia Breasts:  Examined lying and sitting without masses, retractions, discharge or axillary adenopathy. Pelvic:  Ext, BUS, Vagina normal. Pap smear done  Adnexa without masses or tenderness    Anus and perineum normal   Rectovaginal normal sphincter tone without palpated masses or tenderness.    Assessment/Plan:  35 y.o. [id number] female for annual exam, status post LAVH, lysis of adhesions, right salpingectomy for endometriosis 2010. Doing well without complaints.  1. Pap smear 2014. Pap smear done today. Reviewed current screening guidelines and options to stop screening based on hysterectomy for benign indications and no history of significant abnormal Pap smears previously discussed. Patient uncomfortable with stop screening altogether. 2. Mammographic screening recommendations reviewed. No family history of breast cancer. Patient's going to wait until age 17 to start her mammography. SBE monthly reviewed. 3. Health maintenance. No routine lab work done as patient relates this done elsewhere. Follow up  1 year, sooner as needed.   Anastasio Auerbach MD, 9:35 AM 09/23/2016

## 2016-09-23 NOTE — Addendum Note (Signed)
Addended by: Nelva Nay on: 09/23/2016 10:00 AM   Modules accepted: Orders

## 2016-09-25 LAB — PAP IG W/ RFLX HPV ASCU

## 2016-10-19 DIAGNOSIS — N200 Calculus of kidney: Secondary | ICD-10-CM | POA: Diagnosis not present

## 2016-10-19 DIAGNOSIS — R1031 Right lower quadrant pain: Secondary | ICD-10-CM | POA: Diagnosis not present

## 2016-10-19 DIAGNOSIS — R110 Nausea: Secondary | ICD-10-CM | POA: Diagnosis not present

## 2016-10-19 DIAGNOSIS — N201 Calculus of ureter: Secondary | ICD-10-CM | POA: Diagnosis not present

## 2016-10-19 DIAGNOSIS — N1330 Unspecified hydronephrosis: Secondary | ICD-10-CM | POA: Diagnosis not present

## 2016-10-19 DIAGNOSIS — M545 Low back pain: Secondary | ICD-10-CM | POA: Diagnosis not present

## 2017-09-24 ENCOUNTER — Encounter: Payer: Self-pay | Admitting: Gynecology

## 2017-09-24 ENCOUNTER — Ambulatory Visit: Payer: 59 | Admitting: Gynecology

## 2017-09-24 VITALS — BP 120/78 | Ht 65.0 in | Wt 188.0 lb

## 2017-09-24 DIAGNOSIS — Z01419 Encounter for gynecological examination (general) (routine) without abnormal findings: Secondary | ICD-10-CM

## 2017-09-24 NOTE — Progress Notes (Signed)
    [name] 10/09/1980 [id number]        36 y.o.  [id number] for annual gynecologic exam.  Doing well without complaints  Past medical history,surgical history, problem list, medications, allergies, family history and social history were all reviewed and documented as reviewed in the EPIC chart.  ROS:  Performed with pertinent positives and negatives included in the history, assessment and plan.   Additional significant findings : None   Exam: Caryn Bee assistant Vitals:   09/24/17 0822  BP: 120/78  Weight: 188 lb (85.3 kg)  Height: 5\' 5"  (1.651 m)   Body mass index is 31.28 kg/m.  General appearance:  Normal affect, orientation and appearance. Skin: Grossly normal HEENT: Without gross lesions.  No cervical or supraclavicular adenopathy. Thyroid normal.  Lungs:  Clear without wheezing, rales or rhonchi Cardiac: RR, without RMG Abdominal:  Soft, nontender, without masses, guarding, rebound, organomegaly or hernia Breasts:  Examined lying and sitting without masses, retractions, discharge or axillary adenopathy. Pelvic:  Ext, BUS, Vagina: Normal  Adnexa: Without masses or tenderness    Anus and perineum: Normal   Rectovaginal: Normal sphincter tone without palpated masses or tenderness.    Assessment/Plan:  36 y.o. [id number] female for annual gynecologic exam status post LAVH, lysis of adhesions, right salpingectomy for endometriosis 2010.   1. Pap smear 2017.  No Pap smear done today.  No history of significant abnormal Pap smears.  Reviewed current screening guidelines and options to stop screening altogether versus less frequent screening intervals discussed.  Will readdress annually. 2. Breast health.  SBE monthly reviewed.  Breast exam normal today.  Plan first mammogram at age 8.  No history of breast cancer in the family. 3. Health maintenance.  No routine blood work done as patient reports this done elsewhere.  Follow-up 1 year, sooner as needed   Anastasio Auerbach MD, 8:34 AM 09/24/2017

## 2017-09-24 NOTE — Patient Instructions (Signed)
Follow-up in 1 year for annual exam, sooner if any issues. 

## 2017-12-14 DIAGNOSIS — Z0000 Encounter for general adult medical examination without abnormal findings: Secondary | ICD-10-CM | POA: Diagnosis not present

## 2017-12-14 DIAGNOSIS — G4700 Insomnia, unspecified: Secondary | ICD-10-CM | POA: Diagnosis not present

## 2017-12-14 DIAGNOSIS — E119 Type 2 diabetes mellitus without complications: Secondary | ICD-10-CM | POA: Diagnosis not present

## 2017-12-14 DIAGNOSIS — Z23 Encounter for immunization: Secondary | ICD-10-CM | POA: Diagnosis not present

## 2017-12-14 DIAGNOSIS — G43909 Migraine, unspecified, not intractable, without status migrainosus: Secondary | ICD-10-CM | POA: Diagnosis not present

## 2017-12-14 DIAGNOSIS — E785 Hyperlipidemia, unspecified: Secondary | ICD-10-CM | POA: Diagnosis not present

## 2018-05-16 DIAGNOSIS — S9031XA Contusion of right foot, initial encounter: Secondary | ICD-10-CM | POA: Diagnosis not present

## 2018-05-16 DIAGNOSIS — S93491A Sprain of other ligament of right ankle, initial encounter: Secondary | ICD-10-CM | POA: Diagnosis not present

## 2018-06-16 DIAGNOSIS — E119 Type 2 diabetes mellitus without complications: Secondary | ICD-10-CM | POA: Diagnosis not present

## 2018-06-16 DIAGNOSIS — E785 Hyperlipidemia, unspecified: Secondary | ICD-10-CM | POA: Diagnosis not present

## 2018-06-16 DIAGNOSIS — G4700 Insomnia, unspecified: Secondary | ICD-10-CM | POA: Diagnosis not present

## 2018-06-16 DIAGNOSIS — F419 Anxiety disorder, unspecified: Secondary | ICD-10-CM | POA: Diagnosis not present

## 2018-07-28 DIAGNOSIS — Z7984 Long term (current) use of oral hypoglycemic drugs: Secondary | ICD-10-CM | POA: Diagnosis not present

## 2018-09-26 ENCOUNTER — Encounter: Payer: Self-pay | Admitting: Gynecology

## 2018-09-26 ENCOUNTER — Ambulatory Visit (INDEPENDENT_AMBULATORY_CARE_PROVIDER_SITE_OTHER): Payer: BLUE CROSS/BLUE SHIELD | Admitting: Gynecology

## 2018-09-26 VITALS — BP 110/78 | Ht 64.0 in | Wt 186.0 lb

## 2018-09-26 DIAGNOSIS — Z01419 Encounter for gynecological examination (general) (routine) without abnormal findings: Secondary | ICD-10-CM | POA: Diagnosis not present

## 2018-09-26 NOTE — Progress Notes (Signed)
    [name] April 18, 1981 [id number]        37 y.o.  [id number] for annual gynecologic exam.  Without gynecologic complaints  Past medical history,surgical history, problem list, medications, allergies, family history and social history were all reviewed and documented as reviewed in the EPIC chart.  ROS:  Performed with pertinent positives and negatives included in the history, assessment and plan.   Additional significant findings : None   Exam: [name] assistant Vitals:   09/26/18 0757  BP: 110/78  Weight: 186 lb (84.4 kg)  Height: 5\' 4"  (1.626 m)   Body mass index is 31.93 kg/m.  General appearance:  Normal affect, orientation and appearance. Skin: Grossly normal HEENT: Without gross lesions.  No cervical or supraclavicular adenopathy. Thyroid normal.  Lungs:  Clear without wheezing, rales or rhonchi Cardiac: RR, without RMG Abdominal:  Soft, nontender, without masses, guarding, rebound, organomegaly or hernia Breasts:  Examined lying and sitting without masses, retractions, discharge or axillary adenopathy. Pelvic:  Ext, BUS, Vagina: Normal  Adnexa: Without masses or tenderness    Anus and perineum: Normal   Rectovaginal: Normal sphincter tone without palpated masses or tenderness.    Assessment/Plan:  37 y.o. [id number] female for annual gynecologic exam status post LAVH, lysis of adhesions, right salpingectomy for endometriosis 2010.   1. Pap smear 09/2016.  No Pap smear done today.  No history of significant abnormal Pap smears.  Options to stop screening per current screening guidelines based on hysterectomy history reviewed.  Will readdress on an annual basis. 2. Breast health.  Breast exam normal today.  We will plan starting screening mammography at age 49.  SBE monthly reviewed. 3. Health maintenance.  No routine lab work done as patient does this elsewhere.  Follow-up 1 year, sooner as needed.   Anastasio Auerbach MD, 8:11 AM 09/26/2018

## 2018-09-26 NOTE — Patient Instructions (Signed)
Follow-up in 1 year for annual exam, sooner if any issues. 

## 2018-12-16 DIAGNOSIS — Z0000 Encounter for general adult medical examination without abnormal findings: Secondary | ICD-10-CM | POA: Diagnosis not present

## 2018-12-16 DIAGNOSIS — E119 Type 2 diabetes mellitus without complications: Secondary | ICD-10-CM | POA: Diagnosis not present

## 2018-12-16 DIAGNOSIS — E785 Hyperlipidemia, unspecified: Secondary | ICD-10-CM | POA: Diagnosis not present

## 2019-06-27 ENCOUNTER — Encounter: Payer: Self-pay | Admitting: Gynecology

## 2019-10-02 ENCOUNTER — Encounter: Payer: BLUE CROSS/BLUE SHIELD | Admitting: Gynecology

## 2019-11-03 ENCOUNTER — Encounter: Payer: BLUE CROSS/BLUE SHIELD | Admitting: Obstetrics and Gynecology

## 2019-12-11 ENCOUNTER — Other Ambulatory Visit: Payer: Self-pay

## 2019-12-12 ENCOUNTER — Encounter: Payer: Self-pay | Admitting: Obstetrics and Gynecology

## 2019-12-12 ENCOUNTER — Ambulatory Visit (INDEPENDENT_AMBULATORY_CARE_PROVIDER_SITE_OTHER): Payer: BC Managed Care – PPO | Admitting: Obstetrics and Gynecology

## 2019-12-12 VITALS — BP 118/74 | Ht 65.0 in | Wt 182.0 lb

## 2019-12-12 DIAGNOSIS — Z01419 Encounter for gynecological examination (general) (routine) without abnormal findings: Secondary | ICD-10-CM

## 2019-12-12 NOTE — Progress Notes (Signed)
   [name] December 13, 1980 [id number]  SUBJECTIVE:  39 y.o. [id number] female for annual routine gynecologic exam. She has no gynecologic concerns.  Current Outpatient Medications  Medication Sig Dispense Refill  . clonazePAM (KLONOPIN) 0.5 MG tablet Take 0.5 mg by mouth 2 (two) times daily.  0  . metFORMIN (GLUCOPHAGE-XR) 500 MG 24 hr tablet TAKE 2 TABLETS BY MOUTH EVERY DAY WITH BREAKFAST 60 tablet 2  . sertraline (ZOLOFT) 100 MG tablet     . zolpidem (AMBIEN) 10 MG tablet Take 10 mg by mouth at bedtime as needed for sleep.     No current facility-administered medications for this visit.   Allergies: Patient has no known allergies.  No LMP recorded. Patient has had a hysterectomy.  Past medical history,surgical history, problem list, medications, allergies, family history and social history were all reviewed and documented as reviewed in the EPIC chart.  ROS:  Feeling well. No dyspnea or chest pain on exertion.  No abdominal pain, change in bowel habits, black or bloody stools.  No urinary tract symptoms. GYN ROS:  no abnormal bleeding, pelvic pain or discharge, no breast pain or new or enlarging lumps on self exam. No neurological complaints.   OBJECTIVE:  BP 118/74   Ht 5\' 5"  (1.651 m)   Wt 182 lb (82.6 kg)   BMI 30.29 kg/m  The patient appears well, alert, oriented x 3, in no distress. ENT normal.  Neck supple. No cervical or supraclavicular adenopathy or thyromegaly.  Lungs are clear, good air entry, no wheezes, rhonchi or rales. S1 and S2 normal, no murmurs, regular rate and rhythm.  Abdomen soft without tenderness, guarding, mass or organomegaly.  Neurological is normal, no focal findings.  BREAST EXAM: breasts appear normal, no suspicious masses, no skin or nipple changes or axillary nodes  PELVIC EXAM: VULVA: normal appearing vulva with no masses, tenderness or lesions, VAGINA: normal appearing vagina with normal color and discharge, no lesions, CERVIX: surgically absent,  UTERUS: surgically absent, vaginal cuff normal, ADNEXA: normal adnexa in size, nontender and no masses  Chaperone: Caryn Bee present during the examination  ASSESSMENT:  39 y.o. [id number] here for annual gynecologic exam  PLAN:   1. No menstrual or hormonal concerns.  LAVH, lysis of adhesions, right salpingectomy for endometriosis 2010. 2. Pap smear 09/2016.  Current screening guidelines calling for the 3-year Pap smear cytology only interval, and she would be a candidate to discontinue screening given no significant history of abnormal Pap smears and a prior hysterectomy.  She is comfortable not screening today and we will readdress her wishes on a regular basis. 3. Normal breast exam.  Encouraged breast self awareness and notify us of any concerning changes.  She is aware that as she turns 39 the recommendation is for annual mammograms, so she will plan to call to schedule that with one of the local imaging centers. 4. Health maintenance.  Routine screening blood work is performed through her primary care provider.  Return annually or sooner, prn.  Joseph Pierini MD  12/12/19

## 2019-12-18 DIAGNOSIS — E785 Hyperlipidemia, unspecified: Secondary | ICD-10-CM | POA: Diagnosis not present

## 2019-12-18 DIAGNOSIS — E119 Type 2 diabetes mellitus without complications: Secondary | ICD-10-CM | POA: Diagnosis not present

## 2019-12-18 DIAGNOSIS — Z0000 Encounter for general adult medical examination without abnormal findings: Secondary | ICD-10-CM | POA: Diagnosis not present

## 2020-02-27 DIAGNOSIS — R300 Dysuria: Secondary | ICD-10-CM | POA: Diagnosis not present

## 2020-02-27 DIAGNOSIS — N762 Acute vulvitis: Secondary | ICD-10-CM | POA: Diagnosis not present

## 2020-07-05 DIAGNOSIS — F419 Anxiety disorder, unspecified: Secondary | ICD-10-CM | POA: Diagnosis not present

## 2020-07-05 DIAGNOSIS — E119 Type 2 diabetes mellitus without complications: Secondary | ICD-10-CM | POA: Diagnosis not present

## 2020-07-05 DIAGNOSIS — E7800 Pure hypercholesterolemia, unspecified: Secondary | ICD-10-CM | POA: Diagnosis not present

## 2020-07-05 DIAGNOSIS — G4700 Insomnia, unspecified: Secondary | ICD-10-CM | POA: Diagnosis not present

## 2020-07-05 DIAGNOSIS — Z23 Encounter for immunization: Secondary | ICD-10-CM | POA: Diagnosis not present

## 2020-07-05 DIAGNOSIS — E785 Hyperlipidemia, unspecified: Secondary | ICD-10-CM | POA: Diagnosis not present

## 2020-12-20 DIAGNOSIS — E119 Type 2 diabetes mellitus without complications: Secondary | ICD-10-CM | POA: Diagnosis not present

## 2020-12-20 DIAGNOSIS — G4700 Insomnia, unspecified: Secondary | ICD-10-CM | POA: Diagnosis not present

## 2020-12-20 DIAGNOSIS — F419 Anxiety disorder, unspecified: Secondary | ICD-10-CM | POA: Diagnosis not present

## 2020-12-20 DIAGNOSIS — E785 Hyperlipidemia, unspecified: Secondary | ICD-10-CM | POA: Diagnosis not present
